# Patient Record
Sex: Female | Born: 1945 | Race: White | Hispanic: No | Marital: Married | Smoking: Former smoker
Health system: Southern US, Community
[De-identification: ages and names within clinical notes are randomized; demographics above are authoritative.]

## PROBLEM LIST (undated history)

## (undated) DIAGNOSIS — E785 Hyperlipidemia, unspecified: Secondary | ICD-10-CM

## (undated) DIAGNOSIS — F32A Depression, unspecified: Secondary | ICD-10-CM

## (undated) DIAGNOSIS — F329 Major depressive disorder, single episode, unspecified: Secondary | ICD-10-CM

## (undated) HISTORY — DX: Major depressive disorder, single episode, unspecified: F32.9

## (undated) HISTORY — DX: Hyperlipidemia, unspecified: E78.5

## (undated) HISTORY — DX: Depression, unspecified: F32.A

---

## 1988-10-04 HISTORY — PX: TOE SURGERY: SHX1073

## 1999-09-01 ENCOUNTER — Other Ambulatory Visit: Admission: RE | Admit: 1999-09-01 | Discharge: 1999-09-01 | Payer: Self-pay | Admitting: *Deleted

## 2000-08-29 ENCOUNTER — Other Ambulatory Visit: Admission: RE | Admit: 2000-08-29 | Discharge: 2000-08-29 | Payer: Self-pay | Admitting: *Deleted

## 2001-08-28 ENCOUNTER — Other Ambulatory Visit: Admission: RE | Admit: 2001-08-28 | Discharge: 2001-08-28 | Payer: Self-pay | Admitting: *Deleted

## 2002-09-10 ENCOUNTER — Other Ambulatory Visit: Admission: RE | Admit: 2002-09-10 | Discharge: 2002-09-10 | Payer: Self-pay | Admitting: *Deleted

## 2003-10-22 ENCOUNTER — Other Ambulatory Visit: Admission: RE | Admit: 2003-10-22 | Discharge: 2003-10-22 | Payer: Self-pay | Admitting: *Deleted

## 2005-04-27 ENCOUNTER — Other Ambulatory Visit: Admission: RE | Admit: 2005-04-27 | Discharge: 2005-04-27 | Payer: Self-pay | Admitting: *Deleted

## 2006-03-22 ENCOUNTER — Encounter: Admission: RE | Admit: 2006-03-22 | Discharge: 2006-03-22 | Payer: Self-pay | Admitting: Family Medicine

## 2006-03-23 ENCOUNTER — Encounter: Admission: RE | Admit: 2006-03-23 | Discharge: 2006-03-23 | Payer: Self-pay | Admitting: Family Medicine

## 2006-07-21 ENCOUNTER — Other Ambulatory Visit: Admission: RE | Admit: 2006-07-21 | Discharge: 2006-07-21 | Payer: Self-pay | Admitting: *Deleted

## 2008-07-25 ENCOUNTER — Encounter: Admission: RE | Admit: 2008-07-25 | Discharge: 2008-08-13 | Payer: Self-pay | Admitting: Family Medicine

## 2008-09-10 ENCOUNTER — Other Ambulatory Visit: Admission: RE | Admit: 2008-09-10 | Discharge: 2008-09-10 | Payer: Self-pay | Admitting: Gynecology

## 2013-09-13 ENCOUNTER — Ambulatory Visit (INDEPENDENT_AMBULATORY_CARE_PROVIDER_SITE_OTHER): Payer: Medicare Other | Admitting: Emergency Medicine

## 2013-09-13 VITALS — BP 144/80 | HR 74 | Temp 97.9°F | Resp 16 | Ht 67.0 in | Wt 187.2 lb

## 2013-09-13 DIAGNOSIS — S01312A Laceration without foreign body of left ear, initial encounter: Secondary | ICD-10-CM

## 2013-09-13 DIAGNOSIS — S01309A Unspecified open wound of unspecified ear, initial encounter: Secondary | ICD-10-CM

## 2013-09-13 NOTE — Progress Notes (Signed)
[facility] [address], [place] [address] [phone]  Date:  09/13/2013   Name:  Beth Black   DOB:  September 06, 1946   MRN:  [id number]  PCP:  No primary provider on file.    Chief Complaint: Ear Injury   History of Present Illness:  Beth Black is a 67 y.o. very pleasant female patient who presents with the following:  No recollection of injury or antecedent illness.  Noticed it was bleeding starting on arising.  Struck her head on a corner of her table.  No LOC no neuro symptoms.  Current on TT  There are no active problems to display for this patient.   History reviewed. No pertinent past medical history.  History reviewed. No pertinent past surgical history.  History  Substance Use Topics  . Smoking status: Never Smoker   . Smokeless tobacco: Not on file  . Alcohol Use: Yes    Family History  Problem Relation Age of Onset  . Hypertension Mother   . Cancer Father   . Stroke Paternal Grandmother     Allergies  Allergen Reactions  . Doxycycline Itching  . Talwin [Pentazocine] Other (See Comments)    Hallucination     Medication list has been reviewed and updated.  No current outpatient prescriptions on file prior to visit.   No current facility-administered medications on file prior to visit.    Review of Systems:  As per HPI, otherwise negative.    Physical Examination: Filed Vitals:   09/13/13 1353  BP: 144/80  Pulse: 74  Temp: 97.9 F (36.6 C)  Resp: 16   Filed Vitals:   09/13/13 1353  Height: 5\' 7"  (1.702 m)  Weight: 187 lb 3.2 oz (84.913 kg)   Body mass index is 29.31 kg/(m^2). Ideal Body Weight: Weight in (lb) to have BMI = 25: 159.3   GEN: WDWN, NAD, Non-toxic, Alert & Oriented x 3 HEENT: Atraumatic, Normocephalic.  Ears and Nose: No external deformity. EXTR: No clubbing/cyanosis/edema NEURO: Normal gait.  PSYCH: Normally interactive. Conversant. Not depressed or anxious  appearing.  Calm demeanor.  Laceration on left external ear.  Bleeding.  TM negative   Assessment and Plan: Laceration left ear ethibond Follow up as needed  Signed,  Phillips Odor, MD

## 2013-09-13 NOTE — Patient Instructions (Signed)

## 2013-09-15 ENCOUNTER — Encounter: Payer: Self-pay | Admitting: Internal Medicine

## 2013-10-19 ENCOUNTER — Encounter: Payer: Self-pay | Admitting: Internal Medicine

## 2013-12-12 ENCOUNTER — Ambulatory Visit (AMBULATORY_SURGERY_CENTER): Payer: Self-pay | Admitting: *Deleted

## 2013-12-12 VITALS — Ht 66.0 in | Wt 183.6 lb

## 2013-12-12 DIAGNOSIS — Z1211 Encounter for screening for malignant neoplasm of colon: Secondary | ICD-10-CM

## 2013-12-12 MED ORDER — MOVIPREP 100 G PO SOLR: ORAL | Status: DC

## 2013-12-12 NOTE — Progress Notes (Signed)
No allergies to eggs or soy. No problems with anesthesia.  

## 2013-12-13 ENCOUNTER — Encounter: Payer: Self-pay | Admitting: Internal Medicine

## 2013-12-20 ENCOUNTER — Encounter: Payer: Self-pay | Admitting: Internal Medicine

## 2013-12-26 ENCOUNTER — Encounter: Payer: Self-pay | Admitting: Internal Medicine

## 2013-12-26 ENCOUNTER — Ambulatory Visit (AMBULATORY_SURGERY_CENTER): Payer: Medicare Other | Admitting: Internal Medicine

## 2013-12-26 VITALS — BP 130/94 | HR 57 | Temp 97.6°F | Resp 15 | Ht 66.0 in | Wt 183.0 lb

## 2013-12-26 DIAGNOSIS — Z1211 Encounter for screening for malignant neoplasm of colon: Secondary | ICD-10-CM

## 2013-12-26 DIAGNOSIS — D126 Benign neoplasm of colon, unspecified: Secondary | ICD-10-CM

## 2013-12-26 MED ORDER — SODIUM CHLORIDE 0.9 % IV SOLN: 500.0000 mL | INTRAVENOUS | Status: DC

## 2013-12-26 NOTE — Op Note (Addendum)
[facility]  [address] [place], [id number]   COLONOSCOPY PROCEDURE REPORT  PATIENT: Beth Black, Beth Black  MR#: [id number] BIRTHDATE: [birth year] , 67  yrs. old GENDER: Female ENDOSCOPIST: Lafayette Dragon, MD REFERRED UR:[facility] Laurann Montana, M.D. PROCEDURE DATE:  12/26/2013 PROCEDURE:   Colonoscopy with snare polypectomy First Screening Colonoscopy - Avg.  risk and is 50 yrs.  old or older - No.  Prior Negative Screening - Now for repeat screening. 10 or more years since last screening  History of Adenoma - Now for follow-up colonoscopy & has been > or = to 3 yrs.  N/A  Polyps Removed Today? Yes. ASA CLASS:   Class II INDICATIONS:Average risk patient for colon cancer. MEDICATIONS: MAC sedation, administered by CRNA and propofol (Diprivan) 300mg  IV  DESCRIPTION OF PROCEDURE:   After the risks benefits and alternatives of the procedure were thoroughly explained, informed consent was obtained.  A digital rectal exam revealed no abnormalities of the rectum.   The LB [id number]  endoscope was introduced through the anus and advanced to the cecum, which was identified by both the appendix and ileocecal valve. No adverse events experienced.   The quality of the prep was good, using MoviPrep  The instrument was then slowly withdrawn as the colon was fully examined.      COLON FINDINGS: A polypoid shaped semi-pedunculated polyp measuring 12 mm in size was found in the ascending colon.  A polypectomy was performed using snare cautery.  The resection was complete and the polyp tissue was completely retrieved. there was mild diverticulosis of the sigmoid colon Retroflexed views revealed no abnormalities. The time to cecum=9 minutes 46 seconds.  Withdrawal time=8 minutes 16 seconds.  The scope was withdrawn and the procedure completed. COMPLICATIONS: There were no complications.  ENDOSCOPIC IMPRESSION: Semi-pedunculated polyp measuring 12 mm in size was found  in the ascending colon; polypectomy was performed using snare cautery mild diverticulosis of the sigmoid colon  RECOMMENDATIONS: 1.  Await pathology results 2.  hhigh fiber diet Recall colonoscopy pending path report 3.  no aspirin or NSAIDs  for 2 weeks   eSigned:  Lafayette Dragon, MD 12/26/2013 10:18 AM   cc:   PATIENT NAME:  [name], [name] MR#: [id number]

## 2013-12-26 NOTE — Progress Notes (Signed)
Called to room to assist during endoscopic procedure.  Patient ID and intended procedure confirmed with present staff. Received instructions for my participation in the procedure from the performing physician.  

## 2013-12-26 NOTE — Patient Instructions (Signed)
YOU HAD AN ENDOSCOPIC PROCEDURE TODAY AT THE [facility]: Refer to the procedure report that was given to you for any specific questions about what was found during the examination.  If the procedure report does not answer your questions, please call your gastroenterologist to clarify.  If you requested that your care partner not be given the details of your procedure findings, then the procedure report has been included in a sealed envelope for you to review at your convenience later.  YOU SHOULD EXPECT: Some feelings of bloating in the abdomen. Passage of more gas than usual.  Walking can help get rid of the air that was put into your GI tract during the procedure and reduce the bloating. If you had a lower endoscopy (such as a colonoscopy or flexible sigmoidoscopy) you may notice spotting of blood in your stool or on the toilet paper. If you underwent a bowel prep for your procedure, then you may not have a normal bowel movement for a few days.  DIET: Your first meal following the procedure should be a light meal and then it is ok to progress to your normal diet.  A half-sandwich or bowl of soup is an example of a good first meal.  Heavy or fried foods are harder to digest and may make you feel nauseous or bloated.  Likewise meals heavy in dairy and vegetables can cause extra gas to form and this can also increase the bloating.  Drink plenty of fluids but you should avoid alcoholic beverages for 24 hours.  ACTIVITY: Your care partner should take you home directly after the procedure.  You should plan to take it easy, moving slowly for the rest of the day.  You can resume normal activity the day after the procedure however you should NOT DRIVE or use heavy machinery for 24 hours (because of the sedation medicines used during the test).    SYMPTOMS TO REPORT IMMEDIATELY: A gastroenterologist can be reached at any hour.  During normal business hours, 8:30 AM to 5:00 PM Monday through Friday,  call [phone].  After hours and on weekends, please call the GI answering service at [phone] who will take a message and have the physician on call contact you.   Following lower endoscopy (colonoscopy or flexible sigmoidoscopy):  Excessive amounts of blood in the stool  Significant tenderness or worsening of abdominal pains  Swelling of the abdomen that is new, acute  Fever of 100F or higher    FOLLOW UP: If any biopsies were taken you will be contacted by phone or by letter within the next 1-3 weeks.  Call your gastroenterologist if you have not heard about the biopsies in 3 weeks.  Our staff will call the home number listed on your records the next business day following your procedure to check on you and address any questions or concerns that you may have at that time regarding the information given to you following your procedure. This is a courtesy call and so if there is no answer at the home number and we have not heard from you through the emergency physician on call, we will assume that you have returned to your regular daily activities without incident.  SIGNATURES/CONFIDENTIALITY: You and/or your care partner have signed paperwork which will be entered into your electronic medical record.  These signatures attest to the fact that that the information above on your After Visit Summary has been reviewed and is understood.  Full responsibility of the confidentiality   of this discharge information lies with you and/or your care-partner.     

## 2013-12-27 ENCOUNTER — Telehealth: Payer: Self-pay

## 2013-12-27 NOTE — Telephone Encounter (Signed)
  Follow up Call-  Call back number 12/26/2013  Post procedure Call Back phone  # [phone]  Permission to leave phone message Yes     Patient questions:  Do you have a fever, pain , or abdominal swelling? no Pain Score  0 *  Have you tolerated food without any problems? yes  Have you been able to return to your normal activities? yes  Do you have any questions about your discharge instructions: Diet   no Medications  no Follow up visit  no  Do you have questions or concerns about your Care? no  Actions: * If pain score is 4 or above: No action needed, pain <4.

## 2013-12-31 ENCOUNTER — Encounter: Payer: Self-pay | Admitting: Internal Medicine

## 2014-01-01 ENCOUNTER — Encounter: Payer: Self-pay | Admitting: *Deleted

## 2014-03-17 ENCOUNTER — Ambulatory Visit: Payer: Medicare Other | Admitting: Family Medicine

## 2014-03-17 ENCOUNTER — Ambulatory Visit (INDEPENDENT_AMBULATORY_CARE_PROVIDER_SITE_OTHER): Payer: Medicare Other

## 2014-03-17 VITALS — BP 128/82 | HR 75 | Temp 97.5°F | Resp 20 | Ht 66.0 in | Wt 185.6 lb

## 2014-03-17 DIAGNOSIS — R059 Cough, unspecified: Secondary | ICD-10-CM

## 2014-03-17 DIAGNOSIS — R0789 Other chest pain: Secondary | ICD-10-CM

## 2014-03-17 DIAGNOSIS — R05 Cough: Secondary | ICD-10-CM

## 2014-03-17 DIAGNOSIS — J22 Unspecified acute lower respiratory infection: Secondary | ICD-10-CM

## 2014-03-17 IMAGING — CR DG CHEST 2V
2 series · 2 of 2 positions shown · non-contrast
Comparison: CT [DATE]

CLINICAL DATA: Cough, chest tightness.

EXAM:
CHEST  2 VIEW

[PA]
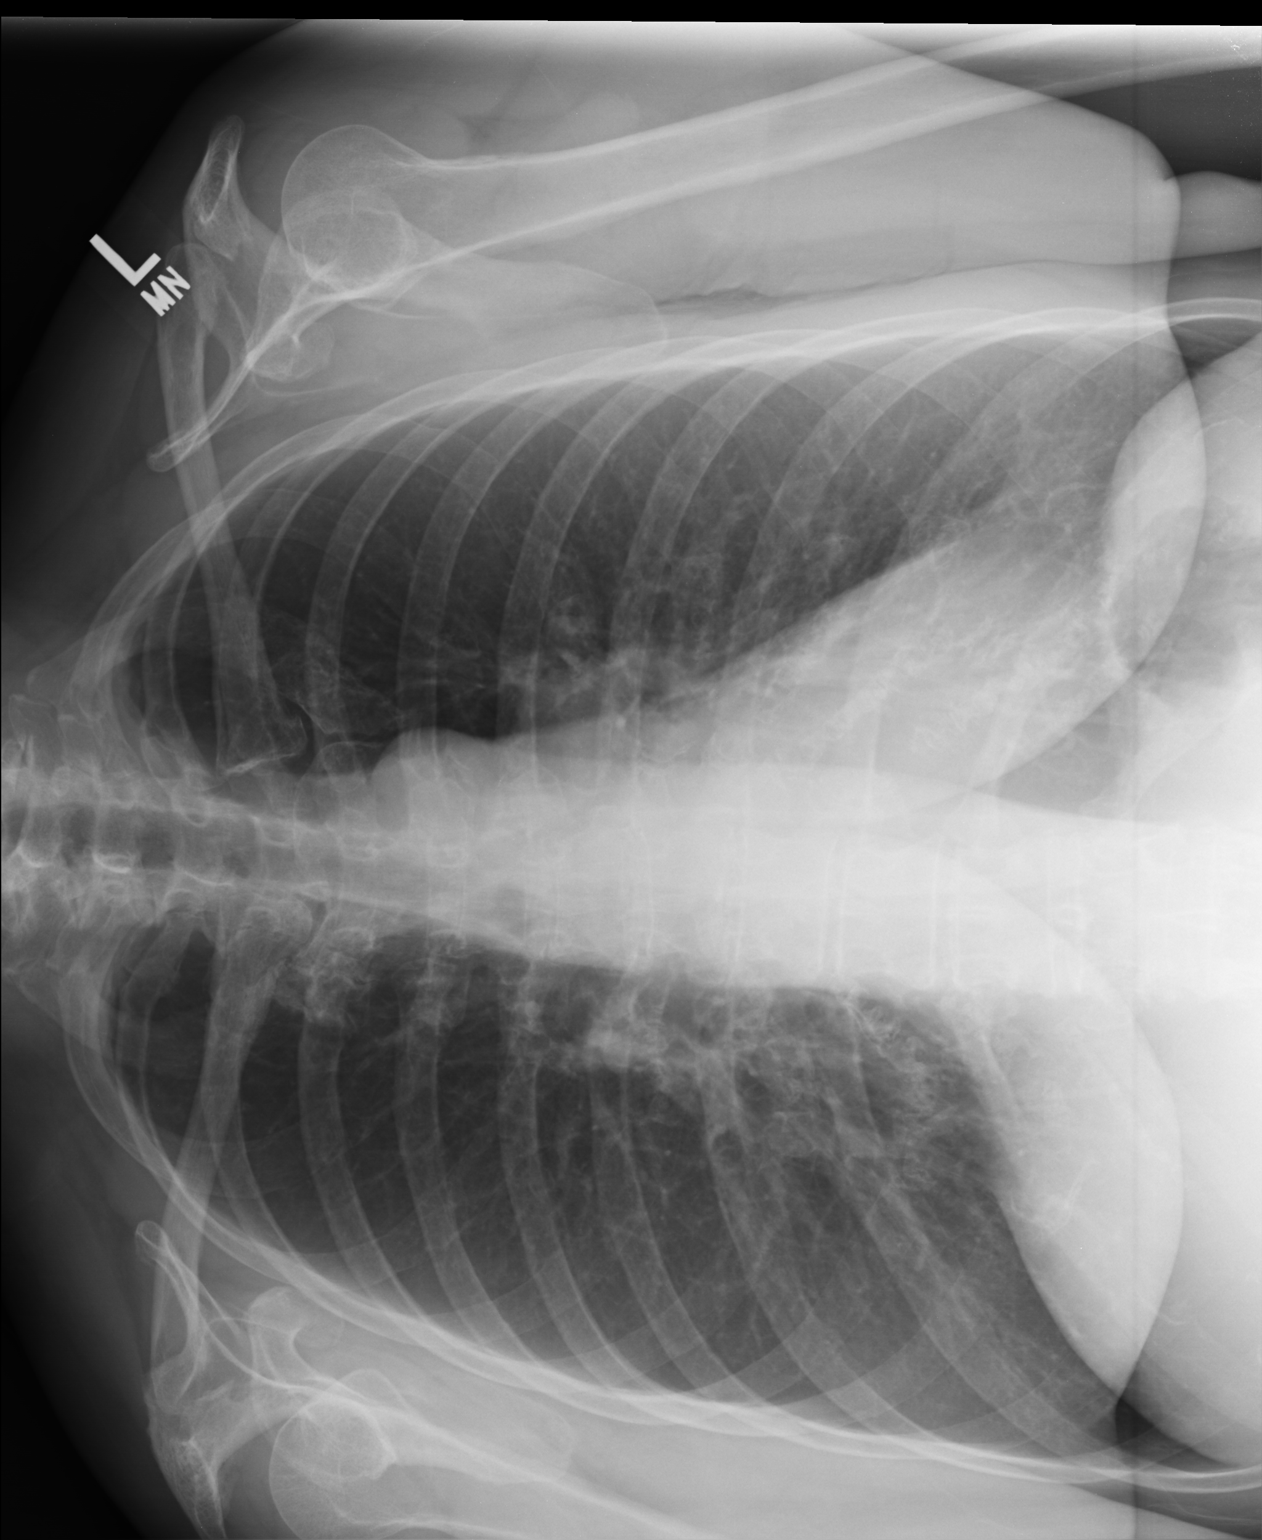

[lateral]
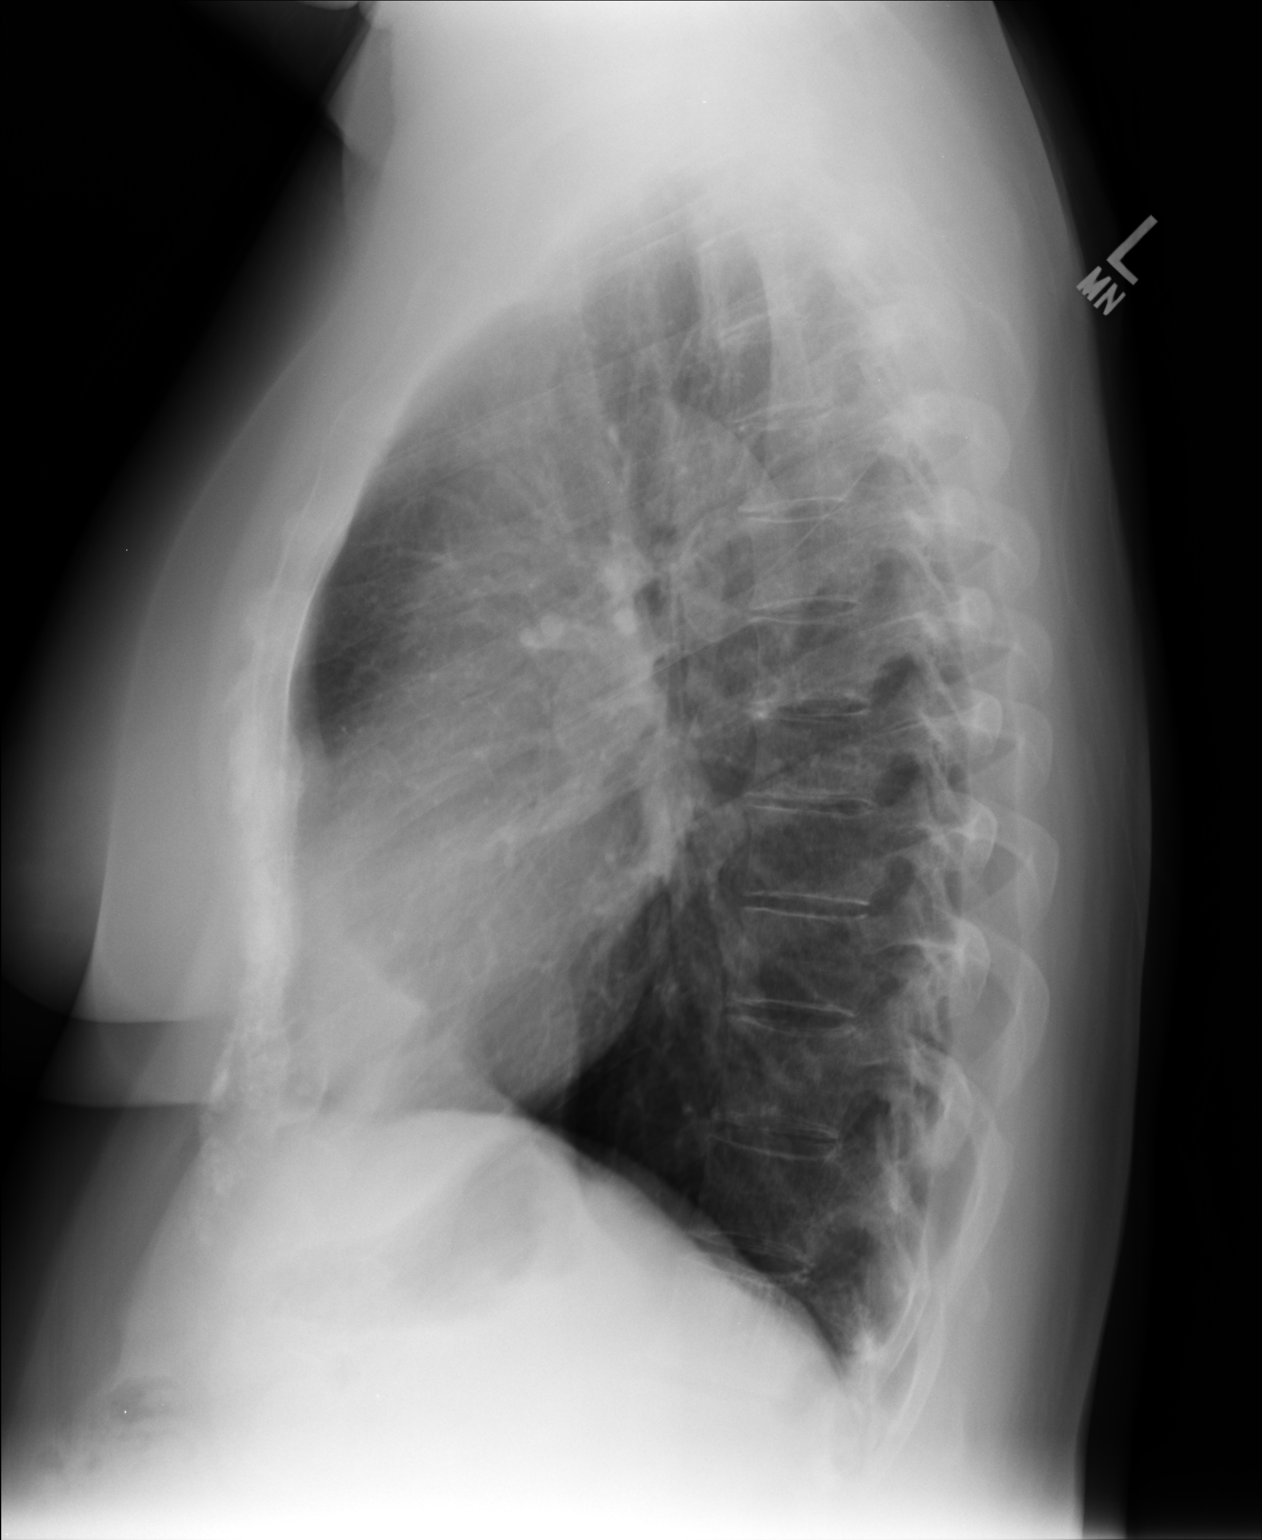

[2 of 2 positions shown; findings below may reference images not displayed]

FINDINGS: Slight hyperinflation of the lungs with increased peribronchial
thickening and bronchial markings in the lower lobes. Findings
likely suggest bronchitis. No confluent airspace opacity. No
effusion. No acute bony abnormality.
IMPRESSION: Bronchitic changes.

## 2014-03-17 MED ORDER — AMOXICILLIN-POT CLAVULANATE 875-125 MG PO TABS: 1.0000 | ORAL_TABLET | Freq: Two times a day (BID) | ORAL | Status: DC

## 2014-03-17 MED ORDER — HYDROCODONE-HOMATROPINE 5-1.5 MG/5ML PO SYRP: 5.0000 mL | ORAL_SOLUTION | Freq: Three times a day (TID) | ORAL | Status: DC | PRN

## 2014-03-17 MED ORDER — BENZONATATE 100 MG PO CAPS: 200.0000 mg | ORAL_CAPSULE | Freq: Two times a day (BID) | ORAL | Status: DC | PRN

## 2014-03-17 NOTE — Progress Notes (Signed)
 Chief Complaint:  Chief Complaint  Patient presents with  . Cough    chest congestion x 2 weeks     HPI: Beth Black is a 67 y.o. female who is here for  Cough, nonproductive otherwise clear sputum. Was visiting grandkids 2 weeks ago when this all started, thought she could just wait it out, no fevers or chills.  No allergies, she has had throat pain due to coughing. No facial pain. Has taken mucinex DM, night time liquid tabs with no relief.  History reviewed. No pertinent past medical history. Past Surgical History  Procedure Laterality Date  . Toe surgery Bilateral 1990    5th toes   History   Social History  . Marital Status: Married    Spouse Name: N/A    Number of Children: N/A  . Years of Education: N/A   Social History Main Topics  . Smoking status: Never Smoker   . Smokeless tobacco: Never Used  . Alcohol Use: 8.4 oz/week    14 Glasses of wine per week  . Drug Use: No  . Sexual Activity: None   Other Topics Concern  . None   Social History Narrative  . None   Family History  Problem Relation Age of Onset  . Hypertension Mother   . Cancer Father   . Stroke Paternal Grandmother   . Colon cancer Neg Hx    Allergies  Allergen Reactions  . Biaxin [Clarithromycin]     Feels "jittery"  . Doxycycline Itching  . Talwin [Pentazocine] Other (See Comments)    Hallucination    Prior to Admission medications   Medication Sig Start Date End Date Taking? Authorizing Provider  escitalopram (LEXAPRO) 10 MG tablet Take 10 mg by mouth daily.    Historical Provider, MD     ROS: The patient denies fevers, chills, night sweats, unintentional weight loss, chest pain, palpitations, wheezing, dyspnea on exertion, nausea, vomiting, abdominal pain, dysuria, hematuria, melena, numbness, weakness, or tingling  All other systems have been reviewed and were otherwise negative with the exception of those mentioned in the HPI and as above.    PHYSICAL  EXAM: Filed Vitals:   03/17/14 0943  BP: 128/82  Pulse: 75  Temp: 97.5 F (36.4 C)  Resp: 20   Filed Vitals:   03/17/14 0943  Height: 5\' 6"  (1.676 m)  Weight: 185 lb 9.6 oz (84.188 kg)   Body mass index is 29.97 kg/(m^2).  General: Alert, no acute distress HEENT:  Normocephalic, atraumatic, oropharynx patent. EOMI, PERRLA, TM nl, no sinus tenderness Cardiovascular:  Regular rate and rhythm, no rubs murmurs or gallops.  No Carotid bruits, radial pulse intact. No pedal edema.  Respiratory: Clear to auscultation bilaterally.  No wheezes, rales, or rhonchi.  No cyanosis, no use of accessory musculature GI: No organomegaly, abdomen is soft and non-tender, positive bowel sounds.  No masses. Skin: No rashes. Neurologic: Facial musculature symmetric. Psychiatric: Patient is appropriate throughout our interaction. Lymphatic: No cervical lymphadenopathy Musculoskeletal: Gait intact.   LABS: No results found for this or any previous visit.   EKG/XRAY:   Primary read interpreted by Dr. Marin Comment at [facility]. ? Increase vascular  Markings right lower lobe  Bronchitic changes vs infiltrate   ASSESSMENT/PLAN: Encounter Diagnoses  Name Primary?  . Cough Yes  . Chest tightness   . Lower respiratory infection    RLL PNA vs increase markings PNA, only on PA view  Will rx augmentin to treat for possible bronchitis.  RX hycodan and tessalon perles for cough  Gross sideeffects, risk and benefits, and alternatives of medications d/w patient. Patient is aware that all medications have potential sideeffects and we are unable to predict every sideeffect or drug-drug interaction that may occur.  , Spiro, DO 03/17/2014 11:21 AM    03/18/2014 LM regarding official xray results. C/w augmentin CLINICAL DATA: Cough, chest tightness.  EXAM:  CHEST 2 VIEW  COMPARISON: CT 03/23/2006  FINDINGS:  Slight hyperinflation of the lungs with increased peribronchial  thickening and bronchial markings  in the lower lobes. Findings  likely suggest bronchitis. No confluent airspace opacity. No  effusion. No acute bony abnormality.  IMPRESSION:  Bronchitic changes.  Electronically Signed  By: Rolm Baptise M.D.  On: 03/17/2014 12:05  T DO

## 2014-12-05 ENCOUNTER — Other Ambulatory Visit: Payer: Self-pay | Admitting: Dermatology

## 2015-05-21 ENCOUNTER — Other Ambulatory Visit: Payer: Self-pay | Admitting: Gynecology

## 2015-05-23 LAB — CYTOLOGY - PAP

## 2018-12-07 ENCOUNTER — Encounter: Payer: Self-pay | Admitting: Gastroenterology

## 2018-12-07 ENCOUNTER — Ambulatory Visit (INDEPENDENT_AMBULATORY_CARE_PROVIDER_SITE_OTHER): Payer: Medicare Other | Admitting: Gastroenterology

## 2018-12-07 ENCOUNTER — Encounter (INDEPENDENT_AMBULATORY_CARE_PROVIDER_SITE_OTHER): Payer: Self-pay

## 2018-12-07 VITALS — BP 140/80 | HR 71 | Ht 65.75 in | Wt 177.0 lb

## 2018-12-07 DIAGNOSIS — R1013 Epigastric pain: Secondary | ICD-10-CM

## 2018-12-07 DIAGNOSIS — Z86010 Personal history of colonic polyps: Secondary | ICD-10-CM

## 2018-12-07 MED ORDER — NA SULFATE-K SULFATE-MG SULF 17.5-3.13-1.6 GM/177ML PO SOLN: 1.0000 | ORAL | 0 refills | Status: DC

## 2018-12-07 NOTE — Progress Notes (Signed)
Referring Provider: Kelton Pillar, MD Primary Care Physician:  Kelton Pillar, MD   Reason for Consultation:  Pain in the stomach   IMPRESSION:  Intermittent epigastric pain, discrete episodes    -No alarm features History of colon polyp    -12 mm tubular adenoma in the ascending colon removed 12/26/2013 (Dr. Olevia Perches)    -Surveillance colonoscopy recommended 12/2018  Etiology of intermittent epigastric pain is unclear.  Differential is broad and includes reflux, erosive esophagitis, gastritis, peptic ulcer disease, as well as biliary colic.  The patient is particularly concerned about the possibility of gallbladder disease.  Without having daily symptoms she is uninterested in using preventive therapy such as a PPI at this time.  Given her age, I am recommending an EGD as well as an abdominal ultrasound.  She is due a surveillance colonoscopy given her history.  PLAN: EGD Colonoscopy Abdominal ultrasound HIDA if ultrasound is negative Office visit after endoscopy  I consented the patient at the bedside today discussing the risks, benefits, and alternatives to endoscopic evaluation. In particular, we discussed the risks that include, but are not limited to, reaction to medication, cardiopulmonary compromise, bleeding requiring blood transfusion, aspiration resulting in pneumonia, perforation requiring surgery, lack of diagnosis, severe illness requiring hospitalization, and even death. We reviewed the risk of missed lesion including polyps or even cancer. The patient acknowledges these risks and asks that we proceed.   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 The history is obtained through the patient and review of her electronic health record.  She has anxiety, hypercholesterolemia, and a urinary tract infection 15 years ago.  Epigastric domino pain pain. Intermittent for years. But the last two episodes have been very  severe.  The most severe episode was last week. Dr. Laurann Montana thought it was acid reflux. However, the patient finds that her reflux normally resolves with Tums. Doubling over with the pain. Associated nausea. Recent increase in eructation. Took one of her husband's Nexium and famotidine. Nothing was touching the pain. No identified triggers, although she ate two sausage biscuits the day that the pain occurred last week. No other associated symptoms. No identified exacerbating or relieving features.   Concerned about possible gallbladder disease as her husband had similar symptoms requiring a cholecystectomy.   A screening colonoscopy with Dr. Olevia Perches 12/26/2013 revealing a 12 mm semi-pedunculated tubular adenoma in the ascending colon.  She was also found to have mild sigmoid diverticulosis.  Surveillance colonoscopy recommended in 5 years.  Father with salivary gland cancer. Daughter with similar problems that turned out to be an appendix.     Past Medical History:  Diagnosis Date  . Depression   . Hyperlipidemia     Past Surgical History:  Procedure Laterality Date  . TOE SURGERY Bilateral 1990   5th toes    Current Outpatient Medications  Medication Sig Dispense Refill  . cycloSPORINE (RESTASIS) 0.05 % ophthalmic emulsion 1 drop 2 (two) times daily.    Marland Kitchen ELDERBERRY PO Take by mouth.    . escitalopram (LEXAPRO) 20 MG tablet Take 20 mg by mouth daily.    Marland Kitchen ezetimibe (ZETIA) 10 MG tablet Take 10 mg by mouth daily.    . Omega-3 Fatty Acids (FISH OIL PO) Take by mouth.    Marland Kitchen VITAMIN D, CHOLECALCIFEROL, PO Take by mouth.     No current facility-administered medications for this visit.     Allergies as of 12/07/2018 - Review Complete 12/07/2018  Allergen  Reaction Noted  . Biaxin [clarithromycin]  12/12/2013  . Doxycycline Itching 09/13/2013  . Talwin [pentazocine] Other (See Comments) 09/13/2013    Family History  Problem Relation Age of Onset  . Hypertension Mother   . Cancer  Father   . Stroke Paternal Grandmother   . Colon cancer Neg Hx     Social History   Socioeconomic History  . Marital status: Married    Spouse name: Not on file  . Number of children: Not on file  . Years of education: Not on file  . Highest education level: Not on file  Occupational History  . Not on file  Social Needs  . Financial resource strain: Not on file  . Food insecurity:    Worry: Not on file    Inability: Not on file  . Transportation needs:    Medical: Not on file    Non-medical: Not on file  Tobacco Use  . Smoking status: Never Smoker  . Smokeless tobacco: Never Used  Substance and Sexual Activity  . Alcohol use: Yes    Alcohol/week: 14.0 standard drinks    Types: 14 Glasses of wine per week  . Drug use: No  . Sexual activity: Not on file  Lifestyle  . Physical activity:    Days per week: Not on file    Minutes per session: Not on file  . Stress: Not on file  Relationships  . Social connections:    Talks on phone: Not on file    Gets together: Not on file    Attends religious service: Not on file    Active member of club or organization: Not on file    Attends meetings of clubs or organizations: Not on file    Relationship status: Not on file  . Intimate partner violence:    Fear of current or ex partner: Not on file    Emotionally abused: Not on file    Physically abused: Not on file    Forced sexual activity: Not on file  Other Topics Concern  . Not on file  Social History Narrative  . Not on file    Review of Systems: 12 system ROS is negative except as noted above with the addition of itching.  Filed Weights   12/07/18 1008  Weight: 177 lb (80.3 kg)    Physical Exam: Vital signs were reviewed. General:   Alert, well-nourished, pleasant and cooperative in NAD Head:  Normocephalic and atraumatic. Eyes:  Sclera clear, no icterus.   Conjunctiva pink. Mouth:  No deformity or lesions.   Neck:  Supple; no thyromegaly. Lungs:  Clear  throughout to auscultation.   No wheezes.  Heart:  Regular rate and rhythm; no murmurs Abdomen:  Soft, nontender, normal bowel sounds. No rebound or guarding. No hepatosplenomegaly Rectal:  Deferred  Msk:  Symmetrical without gross deformities. Extremities:  No gross deformities or edema. Neurologic:  Alert and  oriented x4;  grossly nonfocal Skin:  No rash or bruise. Psych:  Alert and cooperative. Normal mood and affect.   Damisha Wolff L. Tarri Glenn, MD, MPH [facility] 12/07/2018, 10:30 AM

## 2018-12-07 NOTE — Patient Instructions (Signed)
You have been scheduled for an abdominal ultrasound at [facility] (1st floor of hospital) on 12-15-2018 at 8:30 am. Please arrive 15 minutes prior to your appointment for registration. Make certain not to have anything to eat or drink 6 hours prior to your appointment. Should you need to reschedule your appointment, please contact radiology at [phone]. This test typically takes about 30 minutes to perform.  You have been scheduled for an endoscopy. Please follow written instructions given to you at your visit today. If you use inhalers (even only as needed), please bring them with you on the day of your procedure. Your physician has requested that you go to www.startemmi.com and enter the access code given to you at your visit today. This web site gives a general overview about your procedure. However, you should still follow specific instructions given to you by our office regarding your preparation for the procedure.  Tips for colonoscopy:  -STAY WELL HYDRATED FOR 3-4 DAYS PRIOR TO THE EXAM. This reduces nausea and dehydration.  -TO PREVENT SKIN/HEMORRHOID IRRITATION- prior to wiping, put A&Dointment or vaseline on the toilet paper. -Keep a towel or pad on the bed.  -DRINK 64oz of clear liquids in the morning of prep day (PRIOR TO STARTING THE PREP) to be sure that there is enough fluid to flush the colon and stay hydrated!!!! This is in addition to the fluids required for preparation.

## 2018-12-12 ENCOUNTER — Other Ambulatory Visit: Payer: Self-pay

## 2018-12-12 ENCOUNTER — Encounter: Payer: Self-pay | Admitting: Gastroenterology

## 2018-12-12 ENCOUNTER — Ambulatory Visit (AMBULATORY_SURGERY_CENTER): Payer: Medicare Other | Admitting: Gastroenterology

## 2018-12-12 VITALS — BP 130/67 | HR 63 | Temp 96.6°F | Resp 13 | Ht 65.0 in | Wt 177.0 lb

## 2018-12-12 DIAGNOSIS — K2980 Duodenitis without bleeding: Secondary | ICD-10-CM | POA: Diagnosis not present

## 2018-12-12 DIAGNOSIS — Z86010 Personal history of colonic polyps: Secondary | ICD-10-CM

## 2018-12-12 DIAGNOSIS — L539 Erythematous condition, unspecified: Secondary | ICD-10-CM

## 2018-12-12 DIAGNOSIS — K2970 Gastritis, unspecified, without bleeding: Secondary | ICD-10-CM | POA: Diagnosis not present

## 2018-12-12 MED ORDER — OMEPRAZOLE 40 MG PO CPDR: 40.0000 mg | DELAYED_RELEASE_CAPSULE | Freq: Every day | ORAL | 0 refills | Status: DC

## 2018-12-12 MED ORDER — SODIUM CHLORIDE 0.9 % IV SOLN: 500.0000 mL | Freq: Once | INTRAVENOUS | Status: DC

## 2018-12-12 NOTE — Progress Notes (Signed)
PT taken to PACU. Monitors in place. VSS. Report given to RN. 

## 2018-12-12 NOTE — Op Note (Addendum)
[place] Patient Name: Beth Black Procedure Date: 12/12/2018 10:52 AM MRN: [id number] Endoscopist: Thornton Park MD, MD Age: 73 Referring MD:  Date of Birth: June 04, 1946 Gender: Female Account #: [id number] Procedure:                Upper GI endoscopy Indications:              Epigastric abdominal pain Medicines:                See the Anesthesia note for documentation of the                            administered medications Procedure:                Pre-Anesthesia Assessment:                           - Prior to the procedure, a History and Physical                            was performed, and patient medications and                            allergies were reviewed. The patient's tolerance of                            previous anesthesia was also reviewed. The risks                            and benefits of the procedure and the sedation                            options and risks were discussed with the patient.                            All questions were answered, and informed consent                            was obtained. Prior Anticoagulants: The patient has                            taken no previous anticoagulant or antiplatelet                            agents. ASA Grade Assessment: II - A patient with                            mild systemic disease. After reviewing the risks                            and benefits, the patient was deemed in                            satisfactory condition to undergo the procedure.  After obtaining informed consent, the endoscope was                            passed under direct vision. Throughout the                            procedure, the patient's blood pressure, pulse, and                            oxygen saturations were monitored continuously. The                            Endoscope was introduced through the mouth, and                            advanced to the  second part of duodenum. The upper                            GI endoscopy was accomplished without difficulty.                            The patient tolerated the procedure well. Scope In: Scope Out: Findings:                 The esophagus was normal.                           Diffuse mildly erythematous mucosa without bleeding                            was found in the gastric body. Biopsies were taken                            with a cold forceps for histology.                           Diffuse mildly erythematous mucosa without active                            bleeding and with no stigmata of bleeding was found                            in the duodenal bulb. Biopsies were taken with a                            cold forceps for histology.                           The exam was otherwise without abnormality. Complications:            No immediate complications. Estimated blood loss:                            Minimal. Estimated Blood Loss:     Estimated blood loss was minimal. Impression:               -  Normal esophagus.                           - Erythematous mucosa in the gastric body. Biopsied.                           - Erythematous duodenopathy. Biopsied.                           - The examination was otherwise normal. Recommendation:           - Patient has a contact number available for                            emergencies. The signs and symptoms of potential                            delayed complications were discussed with the                            patient. Return to normal activities tomorrow.                            Written discharge instructions were provided to the                            patient.                           - Resume regular diet.                           - Continue present medications.                           - Omeprazole 40 mg daily x 8 weeks.                           - Await pathology results.                           - No  repeat upper endoscopy indicated at this time.                           - Plan outpatient abdominal ultrasound as                            previously scheduled for later this week.                           - Return to GI office in 8 weeks. Thornton Park MD, MD 12/12/2018 11:30:40 AM This report has been signed electronically.

## 2018-12-12 NOTE — Patient Instructions (Signed)
START taking omeprazole (PRILOSEC) 40 mg Once daily for 8 weeks. Medication has been sent to your preferred pharmacy.  Handouts Provided:  High Fiber Diet and Diverticulosis  YOU HAD AN ENDOSCOPIC PROCEDURE TODAY AT [facility]:   Refer to the procedure report that was given to you for any specific questions about what was found during the examination.  If the procedure report does not answer your questions, please call your gastroenterologist to clarify.  If you requested that your care partner not be given the details of your procedure findings, then the procedure report has been included in a sealed envelope for you to review at your convenience later.  YOU SHOULD EXPECT: Some feelings of bloating in the abdomen. Passage of more gas than usual.  Walking can help get rid of the air that was put into your GI tract during the procedure and reduce the bloating. If you had a lower endoscopy (such as a colonoscopy or flexible sigmoidoscopy) you may notice spotting of blood in your stool or on the toilet paper. If you underwent a bowel prep for your procedure, you may not have a normal bowel movement for a few days.  Please Note:  You might notice some irritation and congestion in your nose or some drainage.  This is from the oxygen used during your procedure.  There is no need for concern and it should clear up in a day or so.  SYMPTOMS TO REPORT IMMEDIATELY:   Following lower endoscopy (colonoscopy or flexible sigmoidoscopy):  Excessive amounts of blood in the stool  Significant tenderness or worsening of abdominal pains  Swelling of the abdomen that is new, acute  Fever of 100F or higher   Following upper endoscopy (EGD)  Vomiting of blood or coffee ground material  New chest pain or pain under the shoulder blades  Painful or persistently difficult swallowing  New shortness of breath  Fever of 100F or higher  Black, tarry-looking stools  For urgent or emergent issues, a  gastroenterologist can be reached at any hour by calling [phone].   DIET:  We do recommend a small meal at first, but then you may proceed to your regular diet.  Drink plenty of fluids but you should avoid alcoholic beverages for 24 hours.  ACTIVITY:  You should plan to take it easy for the rest of today and you should NOT DRIVE or use heavy machinery until tomorrow (because of the sedation medicines used during the test).    FOLLOW UP: Our staff will call the number listed on your records the next business day following your procedure to check on you and address any questions or concerns that you may have regarding the information given to you following your procedure. If we do not reach you, we will leave a message.  However, if you are feeling well and you are not experiencing any problems, there is no need to return our call.  We will assume that you have returned to your regular daily activities without incident.  If any biopsies were taken you will be contacted by phone or by letter within the next 1-3 weeks.  Please call us at [phone] if you have not heard about the biopsies in 3 weeks.    SIGNATURES/CONFIDENTIALITY: You and/or your care partner have signed paperwork which will be entered into your electronic medical record.  These signatures attest to the fact that that the information above on your After Visit Summary has been reviewed and is  understood.  Full responsibility of the confidentiality of this discharge information lies with you and/or your care-partner.

## 2018-12-12 NOTE — Progress Notes (Signed)
Called to room to assist during endoscopic procedure.  Patient ID and intended procedure confirmed with present staff. Received instructions for my participation in the procedure from the performing physician.  

## 2018-12-12 NOTE — Op Note (Signed)
[place] Patient Name: Beth Black Procedure Date: 12/12/2018 10:52 AM MRN: [id number] Endoscopist: Thornton Park MD, MD Age: 73 Referring MD:  Date of Birth: 1946/01/21 Gender: Female Account #: [id number] Procedure:                Colonoscopy Indications:              Surveillance: Personal history of adenomatous                            polyps on last colonoscopy 5 years ago, High risk                            colon cancer surveillance: Personal history of                            adenoma (10 mm or greater in size)Intermittent                            epigastric pain, discrete episodes                           -12 mm tubular adenoma in the ascending colon                            removed 12/26/2013 (Dr. Olevia Perches)                           -Surveillance colonoscopy recommended 12/2018 Medicines:                See the Anesthesia note for documentation of the                            administered medications Procedure:                Pre-Anesthesia Assessment:                           - Prior to the procedure, a History and Physical                            was performed, and patient medications and                            allergies were reviewed. The patient's tolerance of                            previous anesthesia was also reviewed. The risks                            and benefits of the procedure and the sedation                            options and risks were discussed with the patient.  All questions were answered, and informed consent                            was obtained. Prior Anticoagulants: The patient has                            taken no previous anticoagulant or antiplatelet                            agents. ASA Grade Assessment: II - A patient with                            mild systemic disease. After reviewing the risks                            and benefits, the patient was deemed in                           satisfactory condition to undergo the procedure.                           After obtaining informed consent, the colonoscope                            was passed under direct vision. Throughout the                            procedure, the patient's blood pressure, pulse, and                            oxygen saturations were monitored continuously. The                            Colonoscope was introduced through the anus and                            advanced to the the terminal ileum, with                            identification of the appendiceal orifice and IC                            valve. The colonoscopy was performed without                            difficulty. The patient tolerated the procedure                            well. The quality of the bowel preparation was                            good. The terminal ileum, ileocecal valve,  appendiceal orifice, and rectum were photographed. Scope In: 11:08:35 AM Scope Out: 11:23:41 AM Scope Withdrawal Time: 0 hours 12 minutes 5 seconds  Total Procedure Duration: 0 hours 15 minutes 6 seconds  Findings:                 The perianal and digital rectal examinations were                            normal.                           Multiple small and large-mouthed diverticula were                            found in the sigmoid colon, descending colon and                            ascending colon. There was no evidence of                            diverticular bleeding.                           The exam was otherwise without abnormality on                            direct and retroflexion views. Complications:            No immediate complications. Estimated Blood Loss:     Estimated blood loss: none. Impression:               - Diverticulosis in the sigmoid colon, in the                            descending colon and in the ascending colon. There                             was no evidence of diverticular bleeding.                           - The examination was otherwise normal on direct                            and retroflexion views.                           - No specimens collected. Recommendation:           - Patient has a contact number available for                            emergencies. The signs and symptoms of potential                            delayed complications were discussed with the  patient. Return to normal activities tomorrow.                            Written discharge instructions were provided to the                            patient.                           - Resume regular diet. High fiber diet recommended.                           - Continue present medications.                           - Current recommendations would be for a                            surveillance colonoscopy in 10 years. Could                            consider endoscopy if clinically appropriate at                            that time. Thornton Park MD, MD 12/12/2018 11:34:19 AM This report has been signed electronically.

## 2018-12-13 ENCOUNTER — Emergency Department (HOSPITAL_COMMUNITY): Payer: Medicare Other

## 2018-12-13 ENCOUNTER — Telehealth: Payer: Self-pay

## 2018-12-13 ENCOUNTER — Telehealth: Payer: Self-pay | Admitting: Gastroenterology

## 2018-12-13 ENCOUNTER — Emergency Department (HOSPITAL_COMMUNITY)
Admission: EM | Admit: 2018-12-13 | Discharge: 2018-12-13 | Disposition: A | Discharge status: Home / Self Care | Payer: Medicare Other | Attending: Emergency Medicine | Admitting: Emergency Medicine

## 2018-12-13 ENCOUNTER — Encounter (HOSPITAL_COMMUNITY): Payer: Self-pay

## 2018-12-13 DIAGNOSIS — Z79899 Other long term (current) drug therapy: Secondary | ICD-10-CM | POA: Insufficient documentation

## 2018-12-13 DIAGNOSIS — R1010 Upper abdominal pain, unspecified: Secondary | ICD-10-CM | POA: Diagnosis present

## 2018-12-13 DIAGNOSIS — K8020 Calculus of gallbladder without cholecystitis without obstruction: Secondary | ICD-10-CM

## 2018-12-13 DIAGNOSIS — R1013 Epigastric pain: Secondary | ICD-10-CM

## 2018-12-13 LAB — CBC WITH DIFFERENTIAL/PLATELET
Abs Immature Granulocytes: 0.03 10*3/uL (ref 0.00–0.07)
Basophils Absolute: 0 10*3/uL (ref 0.0–0.1)
Basophils Relative: 0 %
Eosinophils Absolute: 0 10*3/uL (ref 0.0–0.5)
Eosinophils Relative: 0 %
HCT: 40.6 % (ref 36.0–46.0)
Hemoglobin: 13.6 g/dL (ref 12.0–15.0)
Immature Granulocytes: 0 %
Lymphocytes Relative: 9 %
Lymphs Abs: 0.9 10*3/uL (ref 0.7–4.0)
MCH: 31.1 pg (ref 26.0–34.0)
MCHC: 33.5 g/dL (ref 30.0–36.0)
MCV: 92.7 fL (ref 80.0–100.0)
MONO ABS: 0.4 10*3/uL (ref 0.1–1.0)
Monocytes Relative: 4 %
Neutro Abs: 8.8 10*3/uL — ABNORMAL HIGH (ref 1.7–7.7)
Neutrophils Relative %: 87 %
Platelets: 197 10*3/uL (ref 150–400)
RBC: 4.38 MIL/uL (ref 3.87–5.11)
RDW: 12.3 % (ref 11.5–15.5)
WBC: 10.1 10*3/uL (ref 4.0–10.5)
nRBC: 0 % (ref 0.0–0.2)

## 2018-12-13 LAB — COMPREHENSIVE METABOLIC PANEL
ALT: 31 U/L (ref 0–44)
AST: 26 U/L (ref 15–41)
Albumin: 4.3 g/dL (ref 3.5–5.0)
Alkaline Phosphatase: 70 U/L (ref 38–126)
Anion gap: 11 (ref 5–15)
BUN: 13 mg/dL (ref 8–23)
CO2: 27 mmol/L (ref 22–32)
Calcium: 10 mg/dL (ref 8.9–10.3)
Chloride: 103 mmol/L (ref 98–111)
Creatinine, Ser: 0.79 mg/dL (ref 0.44–1.00)
GFR calc Af Amer: >60 mL/min (ref >60)
GFR calc non Af Amer: >60 mL/min (ref >60)
Glucose, Bld: 158 mg/dL — ABNORMAL HIGH (ref 70–99)
Potassium: 3.8 mmol/L (ref 3.5–5.1)
Sodium: 141 mmol/L (ref 135–145)
Total Bilirubin: 0.8 mg/dL (ref 0.3–1.2)
Total Protein: 7.2 g/dL (ref 6.5–8.1)

## 2018-12-13 LAB — LIPASE, BLOOD: Lipase: 34 U/L (ref 11–51)

## 2018-12-13 MED ORDER — OXYCODONE-ACETAMINOPHEN 5-325 MG PO TABS: 2.0000 | ORAL_TABLET | Freq: Four times a day (QID) | ORAL | 0 refills | Status: DC | PRN

## 2018-12-13 MED ORDER — DICYCLOMINE HCL 20 MG PO TABS: 20.0000 mg | ORAL_TABLET | Freq: Three times a day (TID) | ORAL | 0 refills | Status: DC | PRN

## 2018-12-13 MED ORDER — ONDANSETRON HCL 4 MG/2ML IJ SOLN
4.0000 mg | Freq: Once | INTRAMUSCULAR | Status: AC
  Administered 2018-12-13: 4 mg via INTRAVENOUS
  Filled 2018-12-13: qty 2

## 2018-12-13 MED ORDER — FENTANYL CITRATE (PF) 100 MCG/2ML IJ SOLN
50.0000 ug | Freq: Once | INTRAMUSCULAR | Status: AC
  Administered 2018-12-13: 50 ug via INTRAVENOUS
  Filled 2018-12-13: qty 2

## 2018-12-13 MED ORDER — MORPHINE SULFATE (PF) 4 MG/ML IV SOLN
4.0000 mg | Freq: Once | INTRAVENOUS | Status: AC
  Administered 2018-12-13: 4 mg via INTRAVENOUS
  Filled 2018-12-13: qty 1

## 2018-12-13 MED ORDER — ONDANSETRON 4 MG PO TBDP: 4.0000 mg | ORAL_TABLET | Freq: Four times a day (QID) | ORAL | 0 refills | Status: DC | PRN

## 2018-12-13 NOTE — ED Triage Notes (Signed)
Pt arrived via [facility] due to upper abdominal pain for 6 months. Pt had endoscopy and colonoscopy yesterday that came back unremarkable, pt has ultrasound scheduled Friday. Pt has vomited due to pain.

## 2018-12-13 NOTE — Telephone Encounter (Signed)
I am thankful that she is feeling better. I agree with your recommendations. Many thanks.

## 2018-12-13 NOTE — ED Notes (Signed)
Pt tolerating fluids with no difficulty

## 2018-12-13 NOTE — Telephone Encounter (Signed)
Spoke with patient and she is feeling better. She states she is eager to get gallbladder out. I let her know we will be sending in the referral and she should her something within the next few days. X ray has been canceled. Patient wanted to know if she should take opoid prescriptions or discontinue. I told her only if she was having severe pain otherwise she should be ok taking non NSAID pain relievers such as tylenol. Patient verbalized understanding.

## 2018-12-13 NOTE — Telephone Encounter (Signed)
12/13/2018, 3.30 AM  Patient's husband called.  She had excruciating epigastric pain associated with nausea/vomiting.  Had EGD and colonoscopy performed on 12/12/2018.  Abdominal pain was 10/10.  Advised her to go to ED via EMS.  I also called the nursing supervisor.  X-ray KUB was unremarkable Ultrasound showed cholelithiasis without any cholecystitis. Liver function tests lipase was normal  Patient responded well to fentanyl. Currently is being discharged

## 2018-12-13 NOTE — Telephone Encounter (Signed)
Pt called back and she is doing well from the procd. However she just got out the er due to having gallstones.

## 2018-12-13 NOTE — Telephone Encounter (Signed)
Merrie Roof, Thank you for your care of her early this morning.   Desiree, please call the patient this afternoon to see how she is doing. Thank you.

## 2018-12-13 NOTE — ED Provider Notes (Signed)
TIME SEEN: 5:01 AM  CHIEF COMPLAINT: Abdominal pain  HPI: Patient is a 73 year old female with history of depression and hyperlipidemia who presents to the emergency department with upper abdominal pain and vomiting.  States she has had these intermittent symptoms for 6 months.  Reports she was seen by Dr. Tarri Glenn with [facility] and had an endoscopy and colonoscopy yesterday.  States pain returned last night.  No diarrhea, bloody stools or melena.  No fever.  No chest pain.  Has tried medications for acid reflux without relief.  States that she was given a medication for acid reflux by her GI doctor which she took just prior to arrival without relief.  No previous abdominal surgery.  Has not had any imaging of her abdomen.  States she is supposed to have an ultrasound as an outpatient.  Colonoscopy yesterday revealed diverticulosis in the sigmoid colon, descending colon and ascending colon.  No diverticular bleed.  Endoscopy yesterday revealed erythematous mucosa in the gastric body and erythematous to adenopathy.  It appears she was prescribed omeprazole 40 mg to take daily for the next 8 weeks.  ROS: See HPI Constitutional: no fever  Eyes: no drainage  ENT: no runny nose   Cardiovascular:  no chest pain  Resp: no SOB  GI:  vomiting GU: no dysuria Integumentary: no rash  Allergy: no hives  Musculoskeletal: no leg swelling  Neurological: no slurred speech ROS otherwise negative  PAST MEDICAL HISTORY/PAST SURGICAL HISTORY:  Past Medical History:  Diagnosis Date  . Depression   . Hyperlipidemia     MEDICATIONS:  Prior to Admission medications   Medication Sig Start Date End Date Taking? Authorizing Provider  cycloSPORINE (RESTASIS) 0.05 % ophthalmic emulsion 1 drop 2 (two) times daily.    [provider]  ELDERBERRY PO Take by mouth.    [provider]  escitalopram (LEXAPRO) 20 MG tablet Take 20 mg by mouth daily.    [provider]   ezetimibe (ZETIA) 10 MG tablet Take 10 mg by mouth daily.    [provider]  meclizine (ANTIVERT) 25 MG tablet TAKE 1 TABLET BY MOUTH UP TO EVERY 6 HOURS AS NEEDED FOR DIZZINESS 06/19/18   [provider]  Omega-3 Fatty Acids (FISH OIL PO) Take by mouth.    [provider]  omeprazole (PRILOSEC) 40 MG capsule Take 1 capsule (40 mg total) by mouth daily. 12/12/18   Thornton Park, MD  VITAMIN D, CHOLECALCIFEROL, PO Take by mouth.    [provider]    ALLERGIES:  Allergies  Allergen Reactions  . Biaxin [Clarithromycin]     Feels "jittery"  . Doxycycline Itching  . Talwin [Pentazocine] Other (See Comments)    Hallucination     SOCIAL HISTORY:  Social History   Tobacco Use  . Smoking status: Never Smoker  . Smokeless tobacco: Never Used  Substance Use Topics  . Alcohol use: Yes    Alcohol/week: 14.0 standard drinks    Types: 14 Glasses of wine per week    FAMILY HISTORY: Family History  Problem Relation Age of Onset  . Hypertension Mother   . Cancer Father   . Stroke Paternal Grandmother   . Colon cancer Neg Hx     EXAM: BP (!) 180/91 (BP Location: Right Arm)   Pulse 65   Temp 97.6 F (36.4 C) (Oral)   Resp (!) 23   SpO2 100%  CONSTITUTIONAL: Alert and oriented and responds appropriately to questions. Well-appearing; well-nourished HEAD: Normocephalic EYES: Conjunctivae  clear, pupils appear equal, EOMI ENT: normal nose; moist mucous membranes NECK: Supple, no meningismus, no nuchal rigidity, no LAD  CARD: RRR; S1 and S2 appreciated; no murmurs, no clicks, no rubs, no gallops RESP: Normal chest excursion without splinting or tachypnea; breath sounds clear and equal bilaterally; no wheezes, no rhonchi, no rales, no hypoxia or respiratory distress, speaking full sentences ABD/GI: Normal bowel sounds; non-distended; soft, tender in the epigastric region and right upper quadrant with negative Murphy sign, no rebound, no guarding, no  peritoneal signs, no hepatosplenomegaly BACK:  The back appears normal and is non-tender to palpation, there is no CVA tenderness EXT: Normal ROM in all joints; non-tender to palpation; no edema; normal capillary refill; no cyanosis, no calf tenderness or swelling    SKIN: Normal color for age and race; warm; no rash NEURO: Moves all extremities equally PSYCH: The patient's mood and manner are appropriate. Grooming and personal hygiene are appropriate.  MEDICAL DECISION MAKING: Patient here with abdominal pain.  Has been intermittent for 6 months.  May be cholelithiasis, pancreatitis.  Less likely bowel obstruction, appendicitis.   She reports no improvement with PPI.  Will obtain labs, right upper quadrant ultrasound.  Given she did have endoscopy and colonoscopy yesterday, will obtain upright abdominal film as well.  She seems to be mostly complaining of her chronic pain.  Will give fentanyl, Zofran.  ED PROGRESS: Patient's labs reassuring.  No leukocytosis.  Normal LFTs and lipase.  Ultrasound shows gallstones without cholecystitis or choledocholithiasis.  Suspect this is the cause of her ongoing intermittent symptoms.  She reports pain improved after fentanyl but now coming back.  Will give IV morphine and p.o. challenge.  Anticipate if pain is well controlled that she will be discharged home with pain and nausea medicine and outpatient general surgery follow-up information.   7:40 AM  Pt's pain is been well controlled with IV morphine.  She feels comfortable with plan for discharge home.  Will discharge with prescriptions of Bentyl, Zofran, Percocet to take as needed and given outpatient general surgery follow-up information.  Recommended diet changes.  Discussed at length return precautions.  Patient and husband are comfortable with this plan.   At this time, I do not feel there is any life-threatening condition present. I have reviewed and discussed all results (EKG, imaging, lab, urine as  appropriate) and exam findings with patient/family. I have reviewed nursing notes and appropriate previous records.  I feel the patient is safe to be discharged home without further emergent workup and can continue workup as an outpatient as needed. Discussed usual and customary return precautions. Patient/family verbalize understanding and are comfortable with this plan.  Outpatient follow-up has been provided as needed. All questions have been answered.     Essa Malachi, Delice Bison, DO 12/13/18 [phone]

## 2018-12-13 NOTE — Telephone Encounter (Signed)
  Follow up Call-  Call back number 12/12/2018  Post procedure Call Back phone  # [phone]  Permission to leave phone message Yes  Some recent data might be hidden     Patient questions:  Do you have a fever, pain , or abdominal swelling? Yes.   Pain Score  5 *  Have you tolerated food without any problems? No.  Have you been able to return to your normal activities? No.  Do you have any questions about your discharge instructions: Diet   No. Medications  No. Follow up visit  Yes.    Do you have questions or concerns about your Care? Yes.    Actions: * If pain score is 4 or above: Physician/ provider Notified : Joelene Millin L. Beavers, MD.  Patient in pain but has pain medicine available, just doesn't want to take at the moment, doesn't like narcotics according to caregiver.  Patient seen in the ED last night for pain, nausea, and throwing up.  She consulted the after hours number for [facility], and the person she spoke to told her to go to the ED.  Patient's results were gallstones according to caregiver.  Questioning next step, hoping someone to get her into surgery for her gallbladder.

## 2018-12-13 NOTE — Telephone Encounter (Signed)
No answer, left message to call back later today, B.Jeris Easterly RN. 

## 2018-12-13 NOTE — Discharge Instructions (Addendum)

## 2018-12-13 NOTE — ED Notes (Signed)
Bed: WA17 Expected date:  Expected time:  Means of arrival:  Comments: EMS Dr. Gupta-endoscopy/colonoscopy yesterday-now severe abdominal pain

## 2018-12-14 ENCOUNTER — Telehealth: Payer: Self-pay | Admitting: Gastroenterology

## 2018-12-14 NOTE — Telephone Encounter (Signed)
Pt's husband Fritz Pickerel would like to speak with you regarding pt appt with surgeon to remove her gallbladder. He stated that her wife is in pain and nobody has called them yet to schedule an appt with the surgeon.

## 2018-12-14 NOTE — Telephone Encounter (Signed)
Patient was in the ED last night for abdominal pain.  Referral was faxed to [facility] first thing this am.  She reports terrible pain on her RUQ if she is not absolutely still.  She is advised that if her pain is not well controlled at home she should return to the ED.  Patient has tried dicyclomine and percocet with very little relief.

## 2018-12-15 ENCOUNTER — Ambulatory Visit (HOSPITAL_COMMUNITY): Payer: Medicare Other

## 2018-12-18 ENCOUNTER — Ambulatory Visit: Payer: Self-pay | Admitting: Surgery

## 2018-12-18 NOTE — H&P (Signed)
Beth Black [facility], [place] Documented: 12/18/2018 3:06 PM Location: [facility] Patient #: [id number] DOB: August 11, 1946 Married / Language: English / Race: White Female  History of Present Illness [name] A. [name] MD; 12/18/2018 3:33 PM) Patient words: Patient sent at the request of Dr. Leonides Schanz for epigastric bowel pain for the last 6 months. Over the last 4 weeks, the pain is becoming more intense. It is episodic. Location is epigastric and right upper quadrant with associated nausea and vomiting. She was seen last week in the emergency room where an ultrasound showed gallstones. She is worked up with upper and lower endoscopy and treated for reflux which have not helped.           ADDENDUM REPORT: 12/13/2018 06:10 ADDENDUM: Please excuse dictation error: There are multiple small layering gallbladder calculi with no gallbladder wall thickening or focal tenderness. No renal calculi were visualized. Electronically Signed By: Monte Fantasia M.D. On: 12/13/2018 06:10 Addended by Gilford Silvius, MD on 12/13/2018 6:13 AM  Study Result CLINICAL DATA: Epigastric pain EXAM: ULTRASOUND ABDOMEN LIMITED RIGHT UPPER QUADRANT COMPARISON: None. FINDINGS: Gallbladder: Multiple small layering wall thickening calculi or focal tenderness. No gallbladder Common bile duct: Limited visualization. Diameter: 3 mm. Where visualized, no filling defect. Liver: No focal lesion identified. Within normal limits in parenchymal echogenicity. Portal vein is patent on color Doppler imaging with normal direction of blood flow towards the liver. IMPRESSION: Multiple small renal calculi. No evidence of acute cholecystitis. Electronically Signed: By: Monte Fantasia M.D. On: 12/13/2018 06:01.  The patient is a 73 year old female.   Past Surgical History (Beth Black, [place]; 12/18/2018 3:06 PM) Breast Biopsy Left. Colon Polyp Removal - Colonoscopy Foot Surgery  Bilateral.  Diagnostic Studies History (Beth Black, [place]; 12/18/2018 3:06 PM) Colonoscopy within last year Mammogram within last year Pap Smear 1-5 years ago  Allergies (Beth Black, [place]; 12/18/2018 3:08 PM) Biaxin *MACROLIDES* Toluene *CHEMICALS* Doxycycline *DERMATOLOGICALS* Allergies Reconciled  Medication History (Beth Black, RMA; 12/18/2018 3:09 PM) Zetia (10MG  Tablet, Oral) Active. Restasis (0.05% Emulsion, Ophthalmic) Active. Escitalopram Oxalate (20MG  Tablet, Oral) Active. Medications Reconciled  Social History (Beth Black, [place]; 12/18/2018 3:06 PM) Alcohol use Moderate alcohol use. Caffeine use Coffee. No drug use Tobacco use Former smoker.  Family History (Beth Black, RMA; 12/18/2018 3:06 PM) Cancer Father. Cerebrovascular Accident Mother. Hypertension Mother.  Pregnancy / Birth History (Beth Black, [place]; 12/18/2018 3:06 PM) Age at menarche 72 years. Age of menopause 51-55 Contraceptive History Oral contraceptives. Gravida 1 Irregular periods Length (months) of breastfeeding 7-12 Maternal age 88-30 Para 1  Other Problems (Beth Black, [place]; 12/18/2018 3:06 PM) Anxiety Disorder Depression Hypercholesterolemia     Review of Systems (Beth A. Brown RMA; 12/18/2018 3:06 PM) General Present- Fatigue. Not Present- Appetite Loss, Chills, Fever, Night Sweats, Weight Gain and Weight Loss. Skin Present- Dryness. Not Present- Change in Wart/Mole, Hives, Jaundice, New Lesions, Non-Healing Wounds, Rash and Ulcer. HEENT Present- Wears glasses/contact lenses. Not Present- Earache, Hearing Loss, Hoarseness, Nose Bleed, Oral Ulcers, Ringing in the Ears, Seasonal Allergies, Sinus Pain, Sore Throat, Visual Disturbances and Yellow Eyes. Breast Not Present- Breast Mass, Breast Pain, Nipple Discharge and Skin Changes. Cardiovascular Not Present- Chest Pain, Difficulty Breathing Lying Down, Leg Cramps, Palpitations,  Rapid Heart Rate, Shortness of Breath and Swelling of Extremities. Gastrointestinal Present- Excessive gas. Not Present- Abdominal Pain, Bloating, Bloody Stool, Change in Bowel Habits, Chronic diarrhea, Constipation, Difficulty Swallowing, Gets full quickly at meals, Hemorrhoids, Indigestion, Nausea, Rectal Pain and Vomiting.  Female Genitourinary Not Present- Frequency, Nocturia, Painful Urination, Pelvic Pain and Urgency. Musculoskeletal Not Present- Back Pain, Joint Pain, Joint Stiffness, Muscle Pain, Muscle Weakness and Swelling of Extremities. Neurological Not Present- Decreased Memory, Fainting, Headaches, Numbness, Seizures, Tingling, Tremor, Trouble walking and Weakness. Psychiatric Present- Anxiety. Not Present- Bipolar, Change in Sleep Pattern, Depression, Fearful and Frequent crying. Endocrine Not Present- Cold Intolerance, Excessive Hunger, Hair Changes, Heat Intolerance, Hot flashes and New Diabetes. Hematology Not Present- Blood Thinners, Easy Bruising, Excessive bleeding, Gland problems, HIV and Persistent Infections.  Vitals (Beth A. Brown RMA; 12/18/2018 3:07 PM) 12/18/2018 3:06 PM Weight: 175 lb Height: 65in Body Surface Area: 1.87 m Body Mass Index: 29.12 kg/m  Temp.: 97.69F  Pulse: 79 (Regular)  BP: 128/84 (Sitting, Left Arm, Standard)      Physical Exam (Beth Black A. Naif Alabi MD; 12/18/2018 3:33 PM)  General Mental Status-Alert. General Appearance-Consistent with stated age. Hydration-Well hydrated. Voice-Normal.  Head and Neck Head-normocephalic, atraumatic with no lesions or palpable masses.  Eye Eyeball - Bilateral-Extraocular movements intact. Sclera/Conjunctiva - Bilateral-No scleral icterus.  Chest and Lung Exam Chest and lung exam reveals -quiet, even and easy respiratory effort with no use of accessory muscles and on auscultation, normal breath sounds, no adventitious sounds and normal vocal resonance. Inspection Chest Wall -  Normal. Back - normal.  Cardiovascular Cardiovascular examination reveals -on palpation PMI is normal in location and amplitude, no palpable S3 or S4. Normal cardiac borders., normal heart sounds, regular rate and rhythm with no murmurs, carotid auscultation reveals no bruits and normal pedal pulses bilaterally.  Abdomen Inspection Inspection of the abdomen reveals - No Hernias. Skin - Scar - no surgical scars. Palpation/Percussion Palpation and Percussion of the abdomen reveal - Soft, Non Tender, No Rebound tenderness, No Rigidity (guarding) and No hepatosplenomegaly. Auscultation Auscultation of the abdomen reveals - Bowel sounds normal.  Neurologic Neurologic evaluation reveals -alert and oriented x 3 with no impairment of recent or remote memory. Mental Status-Normal.  Musculoskeletal Normal Exam - Left-Upper Extremity Strength Normal and Lower Extremity Strength Normal. Normal Exam - Right-Upper Extremity Strength Normal, Lower Extremity Weakness.    Assessment & Plan (Joshua Soulier A. Jordanny Waddington MD; 12/18/2018 3:31 PM)  SYMPTOMATIC CHOLELITHIASIS (K80.20) Impression: Recommend laparoscopic cholecystectomy with possible cholangiogram. Discussed the pathophysiology of gallstone disease in her progression of symptoms which would be best treated with cholecystectomy. She is in agreement. The procedure has been discussed with the patient. Risks of laparoscopic cholecystectomy include bleeding, infection, bile duct injury, leak, death, open surgery, diarrhea, other surgery, organ injury, blood vessel injury, DVT, and additional care.  Current Plans You are being scheduled for surgery- Our schedulers will call you.  You should hear from our office's scheduling department within 5 working days about the location, date, and time of surgery. We try to make accommodations for patient's preferences in scheduling surgery, but sometimes the OR schedule or the surgeon's schedule prevents [place]  from making those accommodations.  If you have not heard from our office [phone]) in 5 working days, call the office and ask for your surgeon's nurse.  If you have other questions about your diagnosis, plan, or surgery, call the office and ask for your surgeon's nurse.  Pt Education - Pamphlet Given - Laparoscopic Gallbladder Surgery: discussed with patient and provided information. The anatomy & physiology of hepatobiliary & pancreatic function was discussed. The pathophysiology of gallbladder dysfunction was discussed. Natural history risks without surgery was discussed. I feel the risks of no intervention will lead to serious problems that  outweigh the operative risks; therefore, I recommended cholecystectomy to remove the pathology. I explained laparoscopic techniques with possible need for an open approach. Probable cholangiogram to evaluate the bilary tract was explained as well.  Risks such as bleeding, infection, abscess, leak, injury to other organs, need for further treatment, heart attack, death, and other risks were discussed. I noted a good likelihood this will help address the problem. Possibility that this will not correct all abdominal symptoms was explained. Goals of post-operative recovery were discussed as well. We will work to minimize complications. An educational handout further explaining the pathology and treatment options was given as well. Questions were answered. The patient expresses understanding & wishes to proceed with surgery.  Pt Education - CCS Laparosopic Post Op HCI ([place])

## 2018-12-19 ENCOUNTER — Telehealth: Payer: Self-pay | Admitting: Gastroenterology

## 2018-12-19 NOTE — Telephone Encounter (Signed)
See results of bx results for additional details

## 2018-12-19 NOTE — Telephone Encounter (Signed)
Hi Sheri, pt called stating that she received a call from the office yesterday after 5:00pm. She stated that person who called left a message for pt to call back. I was not able to find any notes, did you by any chance call her?

## 2018-12-21 ENCOUNTER — Encounter (HOSPITAL_COMMUNITY): Payer: Self-pay

## 2018-12-21 ENCOUNTER — Inpatient Hospital Stay (HOSPITAL_COMMUNITY)
Admission: EM | Admit: 2018-12-21 | Discharge: 2018-12-25 | DRG: 419 | Disposition: A | Discharge status: Home / Self Care | Payer: Medicare Other | Attending: General Surgery | Admitting: General Surgery

## 2018-12-21 ENCOUNTER — Other Ambulatory Visit: Payer: Self-pay

## 2018-12-21 DIAGNOSIS — Z888 Allergy status to other drugs, medicaments and biological substances status: Secondary | ICD-10-CM

## 2018-12-21 DIAGNOSIS — K8065 Calculus of gallbladder and bile duct with chronic cholecystitis with obstruction: Secondary | ICD-10-CM | POA: Diagnosis not present

## 2018-12-21 DIAGNOSIS — Z823 Family history of stroke: Secondary | ICD-10-CM

## 2018-12-21 DIAGNOSIS — Z8249 Family history of ischemic heart disease and other diseases of the circulatory system: Secondary | ICD-10-CM

## 2018-12-21 DIAGNOSIS — Z419 Encounter for procedure for purposes other than remedying health state, unspecified: Secondary | ICD-10-CM

## 2018-12-21 DIAGNOSIS — Z79899 Other long term (current) drug therapy: Secondary | ICD-10-CM

## 2018-12-21 DIAGNOSIS — K8010 Calculus of gallbladder with chronic cholecystitis without obstruction: Secondary | ICD-10-CM | POA: Diagnosis present

## 2018-12-21 DIAGNOSIS — E785 Hyperlipidemia, unspecified: Secondary | ICD-10-CM | POA: Diagnosis present

## 2018-12-21 DIAGNOSIS — R932 Abnormal findings on diagnostic imaging of liver and biliary tract: Secondary | ICD-10-CM

## 2018-12-21 DIAGNOSIS — F329 Major depressive disorder, single episode, unspecified: Secondary | ICD-10-CM | POA: Diagnosis present

## 2018-12-21 DIAGNOSIS — K8050 Calculus of bile duct without cholangitis or cholecystitis without obstruction: Secondary | ICD-10-CM | POA: Diagnosis present

## 2018-12-21 DIAGNOSIS — K8080 Other cholelithiasis without obstruction: Secondary | ICD-10-CM

## 2018-12-21 DIAGNOSIS — Z881 Allergy status to other antibiotic agents status: Secondary | ICD-10-CM

## 2018-12-21 LAB — CBC
HCT: 40.2 % (ref 36.0–46.0)
Hemoglobin: 13.3 g/dL (ref 12.0–15.0)
MCH: 30.9 pg (ref 26.0–34.0)
MCHC: 33.1 g/dL (ref 30.0–36.0)
MCV: 93.3 fL (ref 80.0–100.0)
Platelets: 288 10*3/uL (ref 150–400)
RBC: 4.31 MIL/uL (ref 3.87–5.11)
RDW: 11.9 % (ref 11.5–15.5)
WBC: 8.3 10*3/uL (ref 4.0–10.5)
nRBC: 0 % (ref 0.0–0.2)

## 2018-12-21 LAB — URINALYSIS, ROUTINE W REFLEX MICROSCOPIC
Bilirubin Urine: NEGATIVE
GLUCOSE, UA: NEGATIVE mg/dL
Hgb urine dipstick: NEGATIVE
Ketones, ur: NEGATIVE mg/dL
Leukocytes,Ua: NEGATIVE
Nitrite: NEGATIVE
PH: 6 (ref 5.0–8.0)
Protein, ur: NEGATIVE mg/dL
Specific Gravity, Urine: 1.019 (ref 1.005–1.030)

## 2018-12-21 LAB — COMPREHENSIVE METABOLIC PANEL
ALT: 57 U/L — AB (ref 0–44)
AST: 29 U/L (ref 15–41)
Albumin: 4.1 g/dL (ref 3.5–5.0)
Alkaline Phosphatase: 108 U/L (ref 38–126)
Anion gap: 9 (ref 5–15)
BUN: 13 mg/dL (ref 8–23)
CHLORIDE: 101 mmol/L (ref 98–111)
CO2: 27 mmol/L (ref 22–32)
CREATININE: 0.8 mg/dL (ref 0.44–1.00)
Calcium: 8.7 mg/dL — ABNORMAL LOW (ref 8.9–10.3)
GFR calc Af Amer: >60 mL/min (ref >60)
GFR calc non Af Amer: >60 mL/min (ref >60)
Glucose, Bld: 98 mg/dL (ref 70–99)
Potassium: 4.7 mmol/L (ref 3.5–5.1)
Sodium: 137 mmol/L (ref 135–145)
Total Bilirubin: 1.4 mg/dL — ABNORMAL HIGH (ref 0.3–1.2)
Total Protein: 7.1 g/dL (ref 6.5–8.1)

## 2018-12-21 LAB — LIPASE, BLOOD: LIPASE: 48 U/L (ref 11–51)

## 2018-12-21 MED ORDER — OXYCODONE HCL 5 MG PO TABS: 5.0000 mg | ORAL_TABLET | ORAL | Status: DC | PRN

## 2018-12-21 MED ORDER — SODIUM CHLORIDE 0.9 % IV SOLN
2.0000 g | INTRAVENOUS | Status: DC
  Administered 2018-12-22 – 2018-12-24 (×3): 2 g via INTRAVENOUS
  Filled 2018-12-21: qty 20
  Filled 2018-12-21 (×3): qty 2

## 2018-12-21 MED ORDER — SODIUM CHLORIDE 0.9 % IV SOLN
2.0000 g | Freq: Once | INTRAVENOUS | Status: AC
  Administered 2018-12-22: 2 g via INTRAVENOUS
  Filled 2018-12-21: qty 2

## 2018-12-21 MED ORDER — MORPHINE SULFATE (PF) 4 MG/ML IV SOLN
4.0000 mg | Freq: Once | INTRAVENOUS | Status: AC
  Administered 2018-12-21: 4 mg via INTRAVENOUS
  Filled 2018-12-21: qty 1

## 2018-12-21 MED ORDER — KCL IN DEXTROSE-NACL 20-5-0.45 MEQ/L-%-% IV SOLN
INTRAVENOUS | Status: DC
  Administered 2018-12-21 – 2018-12-23 (×4): via INTRAVENOUS
  Filled 2018-12-21 (×8): qty 1000

## 2018-12-21 MED ORDER — ONDANSETRON HCL 4 MG/2ML IJ SOLN: 4.0000 mg | Freq: Four times a day (QID) | INTRAMUSCULAR | Status: DC | PRN

## 2018-12-21 MED ORDER — FENTANYL CITRATE (PF) 100 MCG/2ML IJ SOLN
50.0000 ug | Freq: Once | INTRAMUSCULAR | Status: AC
  Administered 2018-12-21: 50 ug via INTRAVENOUS
  Filled 2018-12-21: qty 2

## 2018-12-21 MED ORDER — PANTOPRAZOLE SODIUM 40 MG IV SOLR
40.0000 mg | Freq: Every day | INTRAVENOUS | Status: DC
  Administered 2018-12-21 – 2018-12-24 (×4): 40 mg via INTRAVENOUS
  Filled 2018-12-21 (×4): qty 40

## 2018-12-21 MED ORDER — SODIUM CHLORIDE 0.9% FLUSH
3.0000 mL | Freq: Once | INTRAVENOUS | Status: AC
  Administered 2018-12-21: 3 mL via INTRAVENOUS

## 2018-12-21 MED ORDER — ONDANSETRON 4 MG PO TBDP: 4.0000 mg | ORAL_TABLET | Freq: Four times a day (QID) | ORAL | Status: DC | PRN

## 2018-12-21 MED ORDER — ACETAMINOPHEN 325 MG PO TABS: 650.0000 mg | ORAL_TABLET | Freq: Four times a day (QID) | ORAL | Status: DC | PRN

## 2018-12-21 MED ORDER — ONDANSETRON HCL 4 MG/2ML IJ SOLN
4.0000 mg | Freq: Once | INTRAMUSCULAR | Status: AC
  Administered 2018-12-21: 4 mg via INTRAVENOUS
  Filled 2018-12-21: qty 2

## 2018-12-21 MED ORDER — SODIUM CHLORIDE 0.9 % IV BOLUS
500.0000 mL | Freq: Once | INTRAVENOUS | Status: AC
  Administered 2018-12-21: 500 mL via INTRAVENOUS

## 2018-12-21 MED ORDER — HYDROMORPHONE HCL 1 MG/ML IJ SOLN
1.0000 mg | INTRAMUSCULAR | Status: DC | PRN
  Administered 2018-12-21 (×2): 1 mg via INTRAVENOUS
  Filled 2018-12-21 (×3): qty 1

## 2018-12-21 MED ORDER — ACETAMINOPHEN 650 MG RE SUPP: 650.0000 mg | Freq: Four times a day (QID) | RECTAL | Status: DC | PRN

## 2018-12-21 NOTE — H&P (Signed)
Beth Black is an 73 y.o. female.    General Surgery [facility]  Chief Complaint: abdominal pain, gallstones  HPI: Patient is a 73 year old female known to my surgical practice having seen my partner, Dr. Christie Beckers, for symptomatic cholelithiasis.  Patient had been scheduled for cholecystectomy in April.  Today she developed episodes of biliary colic which have been unrelenting since 6 AM this morning.  She denies nausea or vomiting.  She has had chills.  Patient presents to the emergency department.  Despite administration of intravenous narcotics, the patient continues to be symptomatic.  General surgery is called for evaluation.  Prior abdominal ultrasound demonstrates multiple gallstones.  Laboratory studies today show minimal change in liver function test.  Patient denies any prior abdominal surgery.  She is accompanied today by her husband.  Past Medical History:  Diagnosis Date  . Depression   . Hyperlipidemia     Past Surgical History:  Procedure Laterality Date  . TOE SURGERY Bilateral 1990   5th toes    Family History  Problem Relation Age of Onset  . Hypertension Mother   . Cancer Father   . Stroke Paternal Grandmother   . Colon cancer Neg Hx    Social History:  reports that she has never smoked. She has never used smokeless tobacco. She reports current alcohol use of about 14.0 standard drinks of alcohol per week. She reports that she does not use drugs.  Allergies:  Allergies  Allergen Reactions  . Biaxin [Clarithromycin]     Feels "jittery"  . Doxycycline Itching  . Talwin [Pentazocine] Other (See Comments)    Hallucination     (Not in a hospital admission)   Results for orders placed or performed during the hospital encounter of 12/21/18 (from the past 48 hour(s))  Lipase, blood     Status: None   Collection Time: 12/21/18  2:46 PM  Result Value Ref Range   Lipase 48 11 - 51 U/L    Comment: Performed at [facility], [place] [address] [place], [place] [address]  Comprehensive metabolic panel     Status: Abnormal   Collection Time: 12/21/18  2:46 PM  Result Value Ref Range   Sodium 137 135 - 145 mmol/L   Potassium 4.7 3.5 - 5.1 mmol/L   Chloride 101 98 - 111 mmol/L   CO2 27 22 - 32 mmol/L   Glucose, Bld 98 70 - 99 mg/dL   BUN 13 8 - 23 mg/dL   Creatinine, Ser 0.80 0.44 - 1.00 mg/dL   Calcium 8.7 (L) 8.9 - 10.3 mg/dL   Total Protein 7.1 6.5 - 8.1 g/dL   Albumin 4.1 3.5 - 5.0 g/dL   AST 29 15 - 41 U/L   ALT 57 (H) 0 - 44 U/L   Alkaline Phosphatase 108 38 - 126 U/L   Total Bilirubin 1.4 (H) 0.3 - 1.2 mg/dL   GFR calc non Af Amer >60 >60 mL/min   GFR calc Af Amer >60 >60 mL/min   Anion gap 9 5 - 15    Comment: Performed at [facility], [place] [address], [place], [place] [address]  CBC     Status: None   Collection Time: 12/21/18  2:46 PM  Result Value Ref Range   WBC 8.3 4.0 - 10.5 K/uL   RBC 4.31 3.87 - 5.11 MIL/uL   Hemoglobin 13.3 12.0 - 15.0 g/dL   HCT 40.2 36.0 - 46.0 %  MCV 93.3 80.0 - 100.0 fL   MCH 30.9 26.0 - 34.0 pg   MCHC 33.1 30.0 - 36.0 g/dL   RDW 11.9 11.5 - 15.5 %   Platelets 288 150 - 400 K/uL   nRBC 0.0 0.0 - 0.2 %    Comment: Performed at [facility], [place] [address], [place], [place] [address]   No results found.  Review of Systems  Constitutional: Positive for chills. Negative for diaphoresis and fever.  HENT: Negative.   Eyes: Negative.   Respiratory: Negative.   Cardiovascular: Negative.   Gastrointestinal: Positive for abdominal pain. Negative for nausea and vomiting.  Genitourinary: Negative.   Musculoskeletal: Negative.   Skin: Negative.   Neurological: Negative.   Endo/Heme/Allergies: Negative.   Psychiatric/Behavioral: Positive for depression.  All other systems reviewed and are negative.   Blood pressure (!) 164/90, pulse 80, temperature 98.1 F (36.7 C), temperature source Oral, resp. rate 16,  height 5\' 5"  (1.651 m), weight 80.2 kg, SpO2 98 %. Physical Exam  Constitutional: She is oriented to person, place, and time. She appears well-developed and well-nourished. She appears distressed.  HENT:  Head: Normocephalic and atraumatic.  Mouth/Throat: No oropharyngeal exudate.  Eyes: Pupils are equal, round, and reactive to light. Conjunctivae are normal. No scleral icterus.  Neck: Normal range of motion. Neck supple. No tracheal deviation present. No thyromegaly present.  Cardiovascular: Normal rate, regular rhythm and normal heart sounds.  No murmur heard. Respiratory: Effort normal and breath sounds normal.  GI: Soft. Bowel sounds are normal. She exhibits no distension and no mass. There is abdominal tenderness (mild RUQ). There is no rebound and no guarding.  Musculoskeletal: Normal range of motion.        General: No deformity or edema.  Neurological: She is alert and oriented to person, place, and time.  Skin: Skin is warm and dry. She is not diaphoretic.  Psychiatric: She has a normal mood and affect. Her behavior is normal.     Assessment/Plan Unrelenting biliary colic, chronic cholecystitis, cholelithiasis  Patient will be admitted from the emergency department to the general surgery service.  We will treat her pain and nausea as well as administer a proton pump inhibitor.  Patient would like to proceed with cholecystectomy during this admission.  Provided that the operating room schedule will allow for an additional case tomorrow or over the weekend, we will plan to proceed with cholecystectomy.  The patient and her husband are in agreement with this plan.  The risks and benefits of the procedure have been discussed at length with the patient.  The patient understands the proposed procedure, potential alternative treatments, and the course of recovery to be expected.  All of the patient's questions have been answered at this time.  The patient wishes to proceed with  surgery.  Armandina Gemma, [facility] Office: [phone]    Armandina Gemma, MD 12/21/2018, 4:34 PM

## 2018-12-21 NOTE — ED Provider Notes (Signed)
  Face-to-face evaluation   History: She presents for evaluation of worsening right upper quadrant leaving abdominal pain with diarrhea.  Recently diagnosed with cholelithiasis, used from 12/13/2018 referred to general surgery.  Patient states that she was doing pretty well yesterday, and did eat some fish that had been fried.  She denies fever, cough or chest pain.  Physical exam: Alert, calm, cooperative.  Heart regular rate and rhythm.  Lungs clear anteriorly.  Abdomen soft with mild right upper quadrant tenderness.  Medical screening examination/treatment/procedure(s) were conducted as a shared visit with non-physician practitioner(s) and myself.  I personally evaluated the patient during the encounter     Daleen Bo, MD 12/23/18 1041

## 2018-12-21 NOTE — ED Triage Notes (Signed)
Patient c/o RUQ pain and diarrhea since 0600 today. patient states she is scheduled for a cholecystectomy 01/29/19

## 2018-12-21 NOTE — ED Notes (Signed)
Transport called to take pt to floor.  

## 2018-12-21 NOTE — ED Provider Notes (Signed)
[facility] DEPT Provider Note   CSN: [id number] Arrival date & time: 12/21/18  1357    History   Chief Complaint Chief Complaint  Patient presents with   Abdominal Pain   Diarrhea    HPI Beth Black is a 73 y.o. female with past medical history of depression, hyperlipidemia, who presents today for evaluation of right upper quadrant abdominal pain.  She was seen for similar on 12/13/2018.  She is scheduled for cholecystectomy on 4/22 with Dr. Brantley Stage.  She has known gallstones.  She reports that at 6 AM her pain woke her up from her sleep.  She reports nausea without vomiting.  She has had 3 loose bowel movements today.  She reports that her pain is a 9-10 out of 10 and feels like it radiates through to her back.  She denies any chest pain or shortness of breath.  No recent sick contacts.  She states that she attempted to call the surgeon twice today and they told her if her pain was this bad she needed to come to the emergency room.       HPI  Past Medical History:  Diagnosis Date   Depression    Hyperlipidemia     Patient Active Problem List   Diagnosis Date Noted   Biliary colic 40/98/1191   Cholelithiasis with chronic cholecystitis 12/21/2018    Past Surgical History:  Procedure Laterality Date   TOE SURGERY Bilateral 1990   5th toes     OB History   No obstetric history on file.      Home Medications    Prior to Admission medications   Medication Sig Start Date End Date Taking? Authorizing Provider  acetaminophen (TYLENOL) 500 MG tablet Take 500-1,000 mg by mouth every 6 (six) hours as needed for moderate pain.   Yes [provider]  cycloSPORINE (RESTASIS) 0.05 % ophthalmic emulsion Place 1 drop into both eyes 2 (two) times daily.    Yes [provider]  dicyclomine (BENTYL) 20 MG tablet Take 1 tablet (20 mg total) by mouth every 8 (eight) hours as needed for spasms (Abdominal cramping).  12/13/18  Yes Ward, Kristen N, DO  ELDERBERRY PO Take 2 tablets by mouth daily.   Yes [provider]  escitalopram (LEXAPRO) 20 MG tablet Take 20 mg by mouth daily.   Yes [provider]  ezetimibe (ZETIA) 10 MG tablet Take 10 mg by mouth daily.   Yes [provider]  Fish Oil-Vitamin D 1000-1000 MG-UNIT CAPS Take 3 capsules by mouth daily.   Yes [provider]  omeprazole (PRILOSEC) 40 MG capsule Take 1 capsule (40 mg total) by mouth daily. 12/12/18  Yes Thornton Park, MD  ondansetron (ZOFRAN ODT) 4 MG disintegrating tablet Take 1 tablet (4 mg total) by mouth every 6 (six) hours as needed for nausea or vomiting. 12/13/18   Ward, Delice Bison, DO  oxyCODONE-acetaminophen (PERCOCET/ROXICET) 5-325 MG tablet Take 2 tablets by mouth every 6 (six) hours as needed. 12/13/18   Ward, Delice Bison, DO    Family History Family History  Problem Relation Age of Onset   Hypertension Mother    Cancer Father    Stroke Paternal Grandmother    Colon cancer Neg Hx     Social History Social History   Tobacco Use   Smoking status: Never Smoker   Smokeless tobacco: Never Used  Substance Use Topics   Alcohol use: Yes    Alcohol/week: 14.0 standard drinks  Types: 14 Glasses of wine per week   Drug use: No     Allergies   Biaxin [clarithromycin]; Doxycycline; and Talwin [pentazocine]   Review of Systems Review of Systems  Constitutional: Negative for chills and fever.  Respiratory: Negative for cough and shortness of breath.   Cardiovascular: Negative for chest pain.  Gastrointestinal: Positive for abdominal pain, diarrhea and nausea. Negative for blood in stool and vomiting.  Musculoskeletal: Positive for back pain.  Neurological: Negative for dizziness and weakness.  All other systems reviewed and are negative.    Physical Exam Updated Vital Signs BP 126/72    Pulse 72    Temp 98.1 F (36.7 C) (Oral)    Resp 20    Ht 5\' 5"  (1.651 m)    Wt 80.2  kg    SpO2 98%    BMI 29.42 kg/m   Physical Exam Vitals signs and nursing note reviewed.  Constitutional:      Appearance: She is well-developed.     Comments: Appears uncomfortable.   HENT:     Head: Normocephalic and atraumatic.     Mouth/Throat:     Mouth: Mucous membranes are moist.  Eyes:     Conjunctiva/sclera: Conjunctivae normal.  Neck:     Musculoskeletal: Neck supple.  Cardiovascular:     Rate and Rhythm: Normal rate and regular rhythm.     Heart sounds: Normal heart sounds. No murmur.  Pulmonary:     Effort: Pulmonary effort is normal. No respiratory distress.     Breath sounds: Normal breath sounds.  Abdominal:     General: Bowel sounds are normal.     Palpations: Abdomen is soft.     Tenderness: There is abdominal tenderness (Worse in RUQ) in the right upper quadrant and epigastric area.     Hernia: No hernia is present.  Skin:    General: Skin is warm and dry.  Neurological:     Mental Status: She is alert.      ED Treatments / Results  Labs (all labs ordered are listed, but only abnormal results are displayed) Labs Reviewed  COMPREHENSIVE METABOLIC PANEL - Abnormal; Notable for the following components:      Result Value   Calcium 8.7 (*)    ALT 57 (*)    Total Bilirubin 1.4 (*)    All other components within normal limits  LIPASE, BLOOD  CBC  URINALYSIS, ROUTINE W REFLEX MICROSCOPIC    EKG None  Radiology No results found.  Procedures Procedures (including critical care time)  Medications Ordered in ED Medications  sodium chloride flush (NS) 0.9 % injection 3 mL (3 mLs Intravenous Given 12/21/18 1445)  fentaNYL (SUBLIMAZE) injection 50 mcg (50 mcg Intravenous Given 12/21/18 1440)  ondansetron (ZOFRAN) injection 4 mg (4 mg Intravenous Given 12/21/18 1439)  morphine 4 MG/ML injection 4 mg (4 mg Intravenous Given by Other 12/21/18 1545)  sodium chloride 0.9 % bolus 500 mL (0 mLs Intravenous Stopped 12/21/18 1645)     Initial Impression /  Assessment and Plan / ED Course  I have reviewed the triage vital signs and the nursing notes.  Pertinent labs & imaging results that were available during my care of the patient were reviewed by me and considered in my medical decision making (see chart for details).  Clinical Course as of Dec 21 1654  Thu Dec 21, 2018  1430 Informed patient RN that paid meds are ordered as patient is in significant pain.    [EH]  1612  Spoke with surgery who will come see patient.    [EH]    Clinical Course User Index [EH] Lorin Glass, PA-C       Patient presents today for evaluation of right upper quadrant abdominal pain and diarrhea.  She has known cholelithiasis and is scheduled for outpatient cholecystectomy next month.  On my exam she appeared uncomfortable with right upper quadrant abdominal pain.  Her pain was initially treated with fentanyl, and Zofran.  This controlled her pain briefly however once it wore off her pain fully returned and she was given morphine. Labs were obtained and reviewed CBC and lipase are both unremarkable.  CMP shows slight elevation in total bilirubin to 1.4, ALT is very slightly elevated at 57, however she is without other electrolyte or hematologic derangements.  She is hemodynamically stable here.  I spoke with on-call surgeon, Dr. Harlow Asa after labs returned, for additional guidance.  Surgeon on call agreed to come see patient.  Patient will be admitted by surgery for pain control and cholecystectomy.  This patient was seen as a shared visit with Dr. Eulis Foster. Patient was hemodynamically stable while in the emergency room.   Final Clinical Impressions(s) / ED Diagnoses   Final diagnoses:  Biliary calculus of other site without obstruction    ED Discharge Orders    None       Lorin Glass, Hershal Coria 12/21/18 1700    Daleen Bo, MD 12/23/18 1040

## 2018-12-22 ENCOUNTER — Encounter (HOSPITAL_COMMUNITY): Payer: Self-pay | Admitting: Emergency Medicine

## 2018-12-22 ENCOUNTER — Encounter (HOSPITAL_COMMUNITY): Admission: EM | Disposition: A | Discharge status: Home / Self Care | Payer: Self-pay | Source: Home / Self Care

## 2018-12-22 ENCOUNTER — Observation Stay (HOSPITAL_COMMUNITY): Payer: Medicare Other

## 2018-12-22 ENCOUNTER — Observation Stay (HOSPITAL_COMMUNITY): Payer: Medicare Other | Admitting: Certified Registered"

## 2018-12-22 DIAGNOSIS — K8064 Calculus of gallbladder and bile duct with chronic cholecystitis without obstruction: Secondary | ICD-10-CM | POA: Diagnosis not present

## 2018-12-22 DIAGNOSIS — R932 Abnormal findings on diagnostic imaging of liver and biliary tract: Secondary | ICD-10-CM | POA: Diagnosis not present

## 2018-12-22 HISTORY — PX: CHOLECYSTECTOMY: SHX55

## 2018-12-22 LAB — SURGICAL PCR SCREEN
MRSA, PCR: NEGATIVE
Staphylococcus aureus: NEGATIVE

## 2018-12-22 SURGERY — LAPAROSCOPIC CHOLECYSTECTOMY WITH INTRAOPERATIVE CHOLANGIOGRAM
Anesthesia: General | Site: Abdomen

## 2018-12-22 MED ORDER — CEFAZOLIN SODIUM-DEXTROSE 2-4 GM/100ML-% IV SOLN
2.0000 g | INTRAVENOUS | Status: DC
  Filled 2018-12-22: qty 100

## 2018-12-22 MED ORDER — ESCITALOPRAM OXALATE 20 MG PO TABS
20.0000 mg | ORAL_TABLET | Freq: Every day | ORAL | Status: DC
  Administered 2018-12-22 – 2018-12-24 (×3): 20 mg via ORAL
  Filled 2018-12-22 (×3): qty 1

## 2018-12-22 MED ORDER — CEFAZOLIN SODIUM-DEXTROSE 2-4 GM/100ML-% IV SOLN
INTRAVENOUS | Status: AC
  Filled 2018-12-22: qty 100

## 2018-12-22 MED ORDER — GABAPENTIN 300 MG PO CAPS
300.0000 mg | ORAL_CAPSULE | ORAL | Status: AC
  Administered 2018-12-22: 300 mg via ORAL
  Filled 2018-12-22: qty 1

## 2018-12-22 MED ORDER — FENTANYL CITRATE (PF) 100 MCG/2ML IJ SOLN
INTRAMUSCULAR | Status: AC
  Filled 2018-12-22: qty 2

## 2018-12-22 MED ORDER — EPHEDRINE SULFATE-NACL 50-0.9 MG/10ML-% IV SOSY
PREFILLED_SYRINGE | INTRAVENOUS | Status: DC | PRN
  Administered 2018-12-22: 10 mg via INTRAVENOUS
  Administered 2018-12-22 (×2): 5 mg via INTRAVENOUS

## 2018-12-22 MED ORDER — SUGAMMADEX SODIUM 200 MG/2ML IV SOLN
INTRAVENOUS | Status: AC
  Filled 2018-12-22: qty 2

## 2018-12-22 MED ORDER — PROPOFOL 10 MG/ML IV BOLUS
INTRAVENOUS | Status: AC
  Filled 2018-12-22: qty 40

## 2018-12-22 MED ORDER — IOPAMIDOL (ISOVUE-300) INJECTION 61%
INTRAVENOUS | Status: DC | PRN
  Administered 2018-12-22: 10 mL

## 2018-12-22 MED ORDER — DEXAMETHASONE SODIUM PHOSPHATE 10 MG/ML IJ SOLN
INTRAMUSCULAR | Status: AC
  Filled 2018-12-22: qty 1

## 2018-12-22 MED ORDER — BUPIVACAINE-EPINEPHRINE (PF) 0.25% -1:200000 IJ SOLN
INTRAMUSCULAR | Status: AC
  Filled 2018-12-22: qty 30

## 2018-12-22 MED ORDER — ONDANSETRON HCL 4 MG/2ML IJ SOLN
INTRAMUSCULAR | Status: AC
  Filled 2018-12-22: qty 2

## 2018-12-22 MED ORDER — DEXAMETHASONE SODIUM PHOSPHATE 10 MG/ML IJ SOLN
INTRAMUSCULAR | Status: DC | PRN
  Administered 2018-12-22: 8 mg via INTRAVENOUS

## 2018-12-22 MED ORDER — PROPOFOL 10 MG/ML IV BOLUS
INTRAVENOUS | Status: DC | PRN
  Administered 2018-12-22: 150 mg via INTRAVENOUS

## 2018-12-22 MED ORDER — CHLORHEXIDINE GLUCONATE CLOTH 2 % EX PADS: 6.0000 | MEDICATED_PAD | Freq: Once | CUTANEOUS | Status: DC

## 2018-12-22 MED ORDER — FENTANYL CITRATE (PF) 100 MCG/2ML IJ SOLN
25.0000 ug | INTRAMUSCULAR | Status: DC | PRN
  Administered 2018-12-22 (×3): 50 ug via INTRAVENOUS

## 2018-12-22 MED ORDER — BUPIVACAINE-EPINEPHRINE 0.5% -1:200000 IJ SOLN
INTRAMUSCULAR | Status: DC | PRN
  Administered 2018-12-22: 20 mL

## 2018-12-22 MED ORDER — FENTANYL CITRATE (PF) 250 MCG/5ML IJ SOLN
INTRAMUSCULAR | Status: DC | PRN
  Administered 2018-12-22 (×2): 50 ug via INTRAVENOUS
  Administered 2018-12-22: 100 ug via INTRAVENOUS

## 2018-12-22 MED ORDER — LACTATED RINGERS IV SOLN
INTRAVENOUS | Status: DC
  Administered 2018-12-22: 10:00:00 via INTRAVENOUS

## 2018-12-22 MED ORDER — SUGAMMADEX SODIUM 200 MG/2ML IV SOLN
INTRAVENOUS | Status: DC | PRN
  Administered 2018-12-22: 200 mg via INTRAVENOUS

## 2018-12-22 MED ORDER — CHLORHEXIDINE GLUCONATE CLOTH 2 % EX PADS
6.0000 | MEDICATED_PAD | Freq: Once | CUTANEOUS | Status: AC
  Administered 2018-12-22: 6 via TOPICAL

## 2018-12-22 MED ORDER — ROCURONIUM BROMIDE 10 MG/ML (PF) SYRINGE
PREFILLED_SYRINGE | INTRAVENOUS | Status: DC | PRN
  Administered 2018-12-22: 30 mg via INTRAVENOUS
  Administered 2018-12-22 (×3): 10 mg via INTRAVENOUS

## 2018-12-22 MED ORDER — ACETAMINOPHEN 500 MG PO TABS
1000.0000 mg | ORAL_TABLET | Freq: Three times a day (TID) | ORAL | Status: DC
  Administered 2018-12-22 – 2018-12-24 (×6): 1000 mg via ORAL
  Filled 2018-12-22 (×7): qty 2

## 2018-12-22 MED ORDER — GADOBUTROL 1 MMOL/ML IV SOLN
8.0000 mL | Freq: Once | INTRAVENOUS | Status: AC | PRN
  Administered 2018-12-22: 8 mL via INTRAVENOUS

## 2018-12-22 MED ORDER — IOPAMIDOL (ISOVUE-300) INJECTION 61%
INTRAVENOUS | Status: AC
  Filled 2018-12-22: qty 50

## 2018-12-22 MED ORDER — ACETAMINOPHEN 500 MG PO TABS
1000.0000 mg | ORAL_TABLET | ORAL | Status: AC
  Administered 2018-12-22: 1000 mg via ORAL
  Filled 2018-12-22: qty 2

## 2018-12-22 MED ORDER — LIDOCAINE 2% (20 MG/ML) 5 ML SYRINGE
INTRAMUSCULAR | Status: DC | PRN
  Administered 2018-12-22: 60 mg via INTRAVENOUS

## 2018-12-22 MED ORDER — OXYCODONE HCL 5 MG PO TABS: 5.0000 mg | ORAL_TABLET | Freq: Once | ORAL | Status: DC | PRN

## 2018-12-22 MED ORDER — SODIUM CHLORIDE (PF) 0.9 % IJ SOLN
INTRAMUSCULAR | Status: DC | PRN
  Administered 2018-12-22: 10 mL

## 2018-12-22 MED ORDER — ROCURONIUM BROMIDE 10 MG/ML (PF) SYRINGE
PREFILLED_SYRINGE | INTRAVENOUS | Status: AC
  Filled 2018-12-22: qty 10

## 2018-12-22 MED ORDER — LIDOCAINE 2% (20 MG/ML) 5 ML SYRINGE
INTRAMUSCULAR | Status: AC
  Filled 2018-12-22: qty 5

## 2018-12-22 MED ORDER — 0.9 % SODIUM CHLORIDE (POUR BTL) OPTIME
TOPICAL | Status: DC | PRN
  Administered 2018-12-22: 1000 mL

## 2018-12-22 MED ORDER — ONDANSETRON HCL 4 MG/2ML IJ SOLN
INTRAMUSCULAR | Status: DC | PRN
  Administered 2018-12-22: 4 mg via INTRAVENOUS

## 2018-12-22 MED ORDER — SUCCINYLCHOLINE CHLORIDE 200 MG/10ML IV SOSY
PREFILLED_SYRINGE | INTRAVENOUS | Status: DC | PRN
  Administered 2018-12-22: 120 mg via INTRAVENOUS

## 2018-12-22 MED ORDER — CYCLOSPORINE 0.05 % OP EMUL
1.0000 [drp] | Freq: Two times a day (BID) | OPHTHALMIC | Status: DC
  Administered 2018-12-22 – 2018-12-24 (×5): 1 [drp] via OPHTHALMIC
  Filled 2018-12-22 (×6): qty 30

## 2018-12-22 MED ORDER — LACTATED RINGERS IR SOLN
Status: DC | PRN
  Administered 2018-12-22: 1000 mL

## 2018-12-22 MED ORDER — OXYCODONE HCL 5 MG/5ML PO SOLN: 5.0000 mg | Freq: Once | ORAL | Status: DC | PRN

## 2018-12-22 MED ORDER — ONDANSETRON HCL 4 MG/2ML IJ SOLN: 4.0000 mg | Freq: Once | INTRAMUSCULAR | Status: DC | PRN

## 2018-12-22 SURGICAL SUPPLY — 39 items
APPLIER CLIP ROT 10 11.4 M/L (STAPLE) ×3
CABLE HIGH FREQUENCY MONO STRZ (ELECTRODE) ×3 IMPLANT
CHLORAPREP W/TINT 26ML (MISCELLANEOUS) ×6 IMPLANT
CLIP APPLIE ROT 10 11.4 M/L (STAPLE) ×1 IMPLANT
CLOSURE WOUND 1/2 X4 (GAUZE/BANDAGES/DRESSINGS) ×1
COVER MAYO STAND STRL (DRAPES) ×3 IMPLANT
COVER SURGICAL LIGHT HANDLE (MISCELLANEOUS) ×3 IMPLANT
COVER WAND RF STERILE (DRAPES) IMPLANT
DECANTER SPIKE VIAL GLASS SM (MISCELLANEOUS) ×3 IMPLANT
DERMABOND ADVANCED (GAUZE/BANDAGES/DRESSINGS) ×2
DERMABOND ADVANCED .7 DNX12 (GAUZE/BANDAGES/DRESSINGS) ×1 IMPLANT
DRAIN CHANNEL 19F RND (DRAIN) ×3 IMPLANT
DRAPE C-ARM 42X120 X-RAY (DRAPES) ×3 IMPLANT
ELECT REM PT RETURN 15FT ADLT (MISCELLANEOUS) ×3 IMPLANT
EVACUATOR SILICONE 100CC (DRAIN) ×3 IMPLANT
GAUZE SPONGE 2X2 8PLY STRL LF (GAUZE/BANDAGES/DRESSINGS) ×1 IMPLANT
GLOVE SURG ORTHO 8.0 STRL STRW (GLOVE) ×3 IMPLANT
GOWN STRL REUS W/TWL XL LVL3 (GOWN DISPOSABLE) ×6 IMPLANT
HEMOSTAT SURGICEL 4X8 (HEMOSTASIS) IMPLANT
KIT BASIN OR (CUSTOM PROCEDURE TRAY) ×3 IMPLANT
KIT TURNOVER KIT A (KITS) IMPLANT
POUCH SPECIMEN RETRIEVAL 10MM (ENDOMECHANICALS) ×3 IMPLANT
SCISSORS LAP 5X35 DISP (ENDOMECHANICALS) ×3 IMPLANT
SET CHOLANGIOGRAPH MIX (MISCELLANEOUS) ×3 IMPLANT
SET IRRIG TUBING LAPAROSCOPIC (IRRIGATION / IRRIGATOR) ×3 IMPLANT
SET TUBE SMOKE EVAC HIGH FLOW (TUBING) IMPLANT
SLEEVE XCEL OPT CAN 5 100 (ENDOMECHANICALS) ×3 IMPLANT
SPONGE GAUZE 2X2 STER 10/PKG (GAUZE/BANDAGES/DRESSINGS) ×2
STRIP CLOSURE SKIN 1/2X4 (GAUZE/BANDAGES/DRESSINGS) ×2 IMPLANT
SUT ETHILON 2 0 PS N (SUTURE) ×3 IMPLANT
SUT MNCRL AB 4-0 PS2 18 (SUTURE) ×6 IMPLANT
SWAB COLLECTION DEVICE MRSA (MISCELLANEOUS) ×3 IMPLANT
TOWEL OR 17X26 10 PK STRL BLUE (TOWEL DISPOSABLE) ×3 IMPLANT
TOWEL OR NON WOVEN STRL DISP B (DISPOSABLE) ×3 IMPLANT
TRAY LAPAROSCOPIC (CUSTOM PROCEDURE TRAY) ×3 IMPLANT
TROCAR BLADELESS OPT 5 100 (ENDOMECHANICALS) ×3 IMPLANT
TROCAR XCEL BLUNT TIP 100MML (ENDOMECHANICALS) ×3 IMPLANT
TROCAR XCEL NON-BLD 11X100MML (ENDOMECHANICALS) ×3 IMPLANT
TUBE ANAEROBIC SPECIMEN COL (MISCELLANEOUS) ×3 IMPLANT

## 2018-12-22 NOTE — Consult Note (Signed)
Referring Provider: Dr. Armandina Gemma  Primary Care Physician:  Kelton Pillar, MD Primary Gastroenterologist:  Dr. Thornton Park   Reason for Consultation:  Acute cholecystitis, cholelithiasis, choledocholithiasis.  S/P Laparoscopic cholecystectomy with intra-operative cholangiography  HPI: Beth Black is a 73 y.o. female with a history of depression, hyperlipidemia, symptomatic cholelithiasis epigastric pain for the past 6 months. She was seen in the ED for similar epigastric pain on  12/13/2018. She ah EGD and colonoscopy with Dr. Tarri Glenn on 12/12/2018. EGD showed duodenitis, stomach biopsies were normal. She was prescribed Omeprazole 46m QD. Colonoscopy showed diverticulosis. Abdominal sonogram in the ED showed multiple small layering wall thickening calculi to the gallbladder. No evidence of acute cholecystitis.  Her LFTs were normal. Her abdominal pain was well controlled after receiving Morphine IV. She was discharged home. She was scheduled for an elective cholecystectomy  01/24/2019 with  Dr. CBrantley Stage She developed severe epigastric pain this morning which awakened her from sleep with some nausea, no vomiting. She had 3 loose bowel movements this morning, no blood. She presented to the ED. Se underwent a laparoscopic cholecystectomy today by Dr. GHarlow Asa An intraoperative cholangiogram was + for a single filling defect to the CBD, stone can not be excluded.  Plan for possible  ERCP tomorrow.   Labs 12/21/2018: Sodium 137.  Potassium 4.7.  Glucose 98.  BUN 13.  Creatinine 0.80.  Calcium 8.7.  Alk phos 108.  Albumin 4.1.  Lipase 48.  AST 29.  ALT 57.  Total bili 1.4. WBC 8.3.  Hemoglobin 13.3.  Hematocrit 40.2.  MCV 93.3.  Platelet 288.  GI history: EGD 12/12/2018:Normal esophagus. Erythematous mucosa in the gastric body, normal bx. Erythematous duodenopathy bx showed duodenitis. Colonoscopy 12/12/2018: Multiple small and large mouth diverticula in the sigmoid colon, descending  colon and ascending colon.  Surveillance colonoscopy in 10 years.   Past Medical History:  Diagnosis Date  . Depression   . Hyperlipidemia     Past Surgical History:  Procedure Laterality Date  . TOE SURGERY Bilateral 1990   5th toes    Prior to Admission medications   Medication Sig Start Date End Date Taking? Authorizing Provider  acetaminophen (TYLENOL) 500 MG tablet Take 500-1,000 mg by mouth every 6 (six) hours as needed for moderate pain.   Yes [provider]  cycloSPORINE (RESTASIS) 0.05 % ophthalmic emulsion Place 1 drop into both eyes 2 (two) times daily.    Yes [provider]  dicyclomine (BENTYL) 20 MG tablet Take 1 tablet (20 mg total) by mouth every 8 (eight) hours as needed for spasms (Abdominal cramping). 12/13/18  Yes Ward, Kristen N, DO  ELDERBERRY PO Take 2 tablets by mouth daily.   Yes [provider]  escitalopram (LEXAPRO) 20 MG tablet Take 20 mg by mouth daily.   Yes [provider]  ezetimibe (ZETIA) 10 MG tablet Take 10 mg by mouth daily.   Yes [provider]  Fish Oil-Vitamin D 1000-1000 MG-UNIT CAPS Take 3 capsules by mouth daily.   Yes [provider]  omeprazole (PRILOSEC) 40 MG capsule Take 1 capsule (40 mg total) by mouth daily. 12/12/18  Yes BThornton Park MD  ondansetron (ZOFRAN ODT) 4 MG disintegrating tablet Take 1 tablet (4 mg total) by mouth every 6 (six) hours as needed for nausea or vomiting. 12/13/18   Ward, KDelice Bison DO  oxyCODONE-acetaminophen (PERCOCET/ROXICET) 5-325 MG tablet Take 2 tablets by mouth every 6 (six) hours as needed. 12/13/18   Ward, KDelice Bison DO  Current Facility-Administered Medications  Medication Dose Route Frequency Provider Last Rate Last Dose  . [MAR Hold] acetaminophen (TYLENOL) tablet 650 mg  650 mg Oral Q6H PRN Armandina Gemma, MD       Or  . Doug Sou Hold] acetaminophen (TYLENOL) suppository 650 mg  650 mg Rectal Q6H PRN Armandina Gemma, MD      . ceFAZolin (ANCEF)  2-4 GM/100ML-% IVPB           . [MAR Hold] cefTRIAXone (ROCEPHIN) 2 g in sodium chloride 0.9 % 100 mL IVPB  2 g Intravenous Q24H Armandina Gemma, MD      . Chlorhexidine Gluconate Cloth 2 % PADS 6 each  6 each Topical Once Cornett, Thomas, MD      . dextrose 5 % and 0.45 % NaCl with KCl 20 mEq/L infusion   Intravenous Continuous Armandina Gemma, MD 100 mL/hr at 12/21/18 2029    . fentaNYL (SUBLIMAZE) 100 MCG/2ML injection           . fentaNYL (SUBLIMAZE) injection 25-50 mcg  25-50 mcg Intravenous Q5 min PRN Lidia Collum, MD   50 mcg at 12/22/18 1404  . [MAR Hold] HYDROmorphone (DILAUDID) injection 1 mg  1 mg Intravenous Q2H PRN Armandina Gemma, MD   1 mg at 12/21/18 2338  . lactated ringers infusion   Intravenous Continuous Brennan Bailey, MD 50 mL/hr at 12/22/18 1028    . [MAR Hold] ondansetron (ZOFRAN-ODT) disintegrating tablet 4 mg  4 mg Oral Q6H PRN Armandina Gemma, MD       Or  . Doug Sou Hold] ondansetron (ZOFRAN) injection 4 mg  4 mg Intravenous Q6H PRN Armandina Gemma, MD      . ondansetron (ZOFRAN) injection 4 mg  4 mg Intravenous Once PRN Lidia Collum, MD      . oxyCODONE (Oxy IR/ROXICODONE) immediate release tablet 5 mg  5 mg Oral Once PRN Lidia Collum, MD       Or  . oxyCODONE (ROXICODONE) 5 MG/5ML solution 5 mg  5 mg Oral Once PRN Lidia Collum, MD      . Doug Sou Hold] oxyCODONE (Oxy IR/ROXICODONE) immediate release tablet 5-10 mg  5-10 mg Oral Q4H PRN Armandina Gemma, MD      . Doug Sou Hold] pantoprazole (PROTONIX) injection 40 mg  40 mg Intravenous Teresita Madura, MD   40 mg at 12/21/18 2029    Allergies as of 12/21/2018 - Review Complete 12/21/2018  Allergen Reaction Noted  . Biaxin [clarithromycin]  12/12/2013  . Doxycycline Itching 09/13/2013  . Talwin [pentazocine] Other (See Comments) 09/13/2013    Family History  Problem Relation Age of Onset  . Hypertension Mother   . Cancer Father   . Stroke Paternal Grandmother   . Colon cancer Neg Hx     Social History    Socioeconomic History  . Marital status: Married    Spouse name: Not on file  . Number of children: Not on file  . Years of education: Not on file  . Highest education level: Not on file  Occupational History  . Not on file  Social Needs  . Financial resource strain: Not on file  . Food insecurity:    Worry: Not on file    Inability: Not on file  . Transportation needs:    Medical: Not on file    Non-medical: Not on file  Tobacco Use  . Smoking status: Never Smoker  . Smokeless tobacco: Never Used  Substance and Sexual Activity  .  Alcohol use: Yes    Alcohol/week: 14.0 standard drinks    Types: 14 Glasses of wine per week  . Drug use: No  . Sexual activity: Not on file  Lifestyle  . Physical activity:    Days per week: Not on file    Minutes per session: Not on file  . Stress: Not on file  Relationships  . Social connections:    Talks on phone: Not on file    Gets together: Not on file    Attends religious service: Not on file    Active member of club or organization: Not on file    Attends meetings of clubs or organizations: Not on file    Relationship status: Not on file  . Intimate partner violence:    Fear of current or ex partner: Not on file    Emotionally abused: Not on file    Physically abused: Not on file    Forced sexual activity: Not on file  Other Topics Concern  . Not on file  Social History Narrative  . Not on file    Review of Systems: Limited ROS as patient is lethargic from anesthesia, recovering in PACU. See HPI  Physical Exam: Vital signs in last 24 hours: Temp:  [97.9 F (36.6 C)-99.4 F (37.4 C)] 98.7 F (37.1 C) (03/20 1355) Pulse Rate:  [70-98] 98 (03/20 1400) Resp:  [11-20] 11 (03/20 1400) BP: (126-151)/(63-85) 149/71 (03/20 1400) SpO2:  [91 %-100 %] 100 % (03/20 1400) Weight:  [79.4 kg] 79.4 kg (03/20 1016) Last BM Date: 12/21/18 General:   Alert,  Well-developed, well-nourished, pleasant and cooperative in NAD Head:   Normocephalic and atraumatic. Eyes:  Sclera clear, no icterus.   Conjunctiva pink. Mouth:  No deformity or lesions.   Neck:  Supple; no masses or thyromegaly. Lungs: Clear Throughout Heart: .RRR, no murmur Abdomen: Laparoscopic incision intact, small amt blood oozing from site  Rectal:  Deferred  Msk:  Symmetrical without gross deformities. . Pulses:  Normal pulses noted. Extremities:  Without clubbing or edema. Neurologic:  Alert and  oriented x4;  grossly normal neurologically. Skin:  Intact without significant lesions or rashes.. Psych:  Alert and cooperative. Normal mood and affect.  Intake/Output from previous day: 03/19 0701 - 03/20 0700 In: 1695.1 [P.O.:200; I.V.:895.1; IV Piggyback:600] Out: 760 [Urine:760] Intake/Output this shift: Total I/O In: 1215 [I.V.:1215] Out: 275 [Urine:250; Blood:25]  Lab Results: Recent Labs    12/21/18 1446  WBC 8.3  HGB 13.3  HCT 40.2  PLT 288   BMET Recent Labs    12/21/18 1446  NA 137  K 4.7  CL 101  CO2 27  GLUCOSE 98  BUN 13  CREATININE 0.80  CALCIUM 8.7*   LFT Recent Labs    12/21/18 1446  PROT 7.1  ALBUMIN 4.1  AST 29  ALT 57*  ALKPHOS 108  BILITOT 1.4*     Studies/Results: Dg Cholangiogram Operative  Result Date: 12/22/2018 CLINICAL DATA:  Intraoperative cholangiogram. EXAM: INTRAOPERATIVE CHOLANGIOGRAM TECHNIQUE: Cholangiographic images from the C-arm fluoroscopic device were submitted for interpretation post-operatively. Please see the procedural report for the amount of contrast and the fluoroscopy time utilized. COMPARISON:  None. FINDINGS: Contrast fills the biliary tree and duodenum. There is a single filling defect in the common bile duct. Bile duct stone is not excluded. The common bile duct is dilated. IMPRESSION: See above. Electronically Signed   By: Marybelle Killings M.D.   On: 12/22/2018 13:28    IMPRESSION/PLAN:  1. 72  y.o. female with a hx of cholelithiasis scented to the ED with acute  cholecystitis, cholelithiasis and probable choledocholithiasis. No fever.  WBC 8.3.She is status post laparoscopic cholecystectomy. Intraoperative cholangiogram identified a single filling defect in the common bile duct, bile duct stone could not be excluded, the common bile duct was dilated. No immediate postoperative complications.  -CBC, CMP and Lipase level in a.m. -Plan for ERCP 12/23/2018 -post operative management per surgery   2. Elevated LFTs secondary to # 1. ALT 51. T. Bili 1.4.    Further recommendations per Dr. Phylliss Blakes Dorathy Daft  12/22/2018, 2:05 PM

## 2018-12-22 NOTE — Transfer of Care (Signed)
Immediate Anesthesia Transfer of Care Note  Patient: Beth Black  Procedure(s) Performed: LAPAROSCOPIC CHOLECYSTECTOMY WITH INTRAOPERATIVE CHOLANGIOGRAM (N/A Abdomen)  Patient Location: PACU  Anesthesia Type:General  Level of Consciousness: awake, drowsy and patient cooperative  Airway & Oxygen Therapy: Patient Spontanous Breathing and Patient connected to face mask oxygen  Post-op Assessment: Report given to RN and Post -op Vital signs reviewed and stable  Post vital signs: Reviewed and stable  Last Vitals:  Vitals Value Taken Time  BP    Temp    Pulse 96 12/22/2018  1:55 PM  Resp 11 12/22/2018  1:55 PM  SpO2 100 % 12/22/2018  1:55 PM  Vitals shown include unvalidated device data.  Last Pain:  Vitals:   12/22/18 1020  TempSrc: Oral  PainSc:       Patients Stated Pain Goal: 4 (33/43/56 8616)  Complications: No apparent anesthesia complications

## 2018-12-22 NOTE — Anesthesia Preprocedure Evaluation (Addendum)
Anesthesia Evaluation  Patient identified by MRN, date of birth, ID band Patient awake    Reviewed: Allergy & Precautions, NPO status , Patient's Chart, lab work & pertinent test results  History of Anesthesia Complications Negative for: history of anesthetic complications  Airway Mallampati: III  TM Distance: >3 FB Neck ROM: Full    Dental no notable dental hx.    Pulmonary neg pulmonary ROS,    Pulmonary exam normal        Cardiovascular negative cardio ROS Normal cardiovascular exam     Neuro/Psych PSYCHIATRIC DISORDERS Depression negative neurological ROS     GI/Hepatic Neg liver ROS, GERD  Medicated,  Endo/Other  negative endocrine ROS  Renal/GU negative Renal ROS  negative genitourinary   Musculoskeletal negative musculoskeletal ROS (+)   Abdominal   Peds  Hematology negative hematology ROS (+)   Anesthesia Other Findings   Reproductive/Obstetrics                            Anesthesia Physical Anesthesia Plan  ASA: II  Anesthesia Plan: General   Post-op Pain Management:    Induction: Intravenous and Rapid sequence  PONV Risk Score and Plan: 3 and Ondansetron, Dexamethasone and Treatment may vary due to age or medical condition  Airway Management Planned: Oral ETT  Additional Equipment: None  Intra-op Plan:   Post-operative Plan: Extubation in OR  Informed Consent: I have reviewed the patients History and Physical, chart, labs and discussed the procedure including the risks, benefits and alternatives for the proposed anesthesia with the patient or authorized representative who has indicated his/her understanding and acceptance.     Dental advisory given  Plan Discussed with:   Anesthesia Plan Comments:        Anesthesia Quick Evaluation

## 2018-12-22 NOTE — Progress Notes (Signed)
     Assessment & Plan: HD#2 biliary colic, cholecystitis, cholelithiasis  Pain improved with IV narcotics  Plan lap chole with IOC today  The risks and benefits of the procedure have been discussed at length with the patient.  The patient understands the proposed procedure, potential alternative treatments, and the course of recovery to be expected.  All of the patient's questions have been answered at this time.  The patient wishes to proceed with surgery.        Armandina Gemma, MD       [facility]       Office: [phone]   Chief Complaint: Abdominal pain  Subjective: Patient more comfortable this morning.  Husband at bedside.  Objective: Vital signs in last 24 hours: Temp:  [97.9 F (36.6 C)-99.4 F (37.4 C)] 99.4 F (37.4 C) (03/20 0433) Pulse Rate:  [70-80] 79 (03/20 0433) Resp:  [14-20] 16 (03/20 0433) BP: (126-164)/(71-90) 132/80 (03/20 0433) SpO2:  [91 %-100 %] 93 % (03/20 0433) Weight:  [80.2 kg] 80.2 kg (03/19 1403) Last BM Date: 12/21/18  Intake/Output from previous day: 03/19 0701 - 03/20 0700 In: 1695.1 [P.O.:200; I.V.:895.1; IV Piggyback:600] Out: 760 [Urine:760] Intake/Output this shift: No intake/output data recorded.  Physical Exam: HEENT - sclerae clear, mucous membranes moist Neck - soft Chest - clear bilaterally Cor - RRR Abdomen - soft, mild RUQ tenderness, no mass Ext - no edema, non-tender Neuro - alert & oriented, no focal deficits  Lab Results:  Recent Labs    12/21/18 1446  WBC 8.3  HGB 13.3  HCT 40.2  PLT 288   BMET Recent Labs    12/21/18 1446  NA 137  K 4.7  CL 101  CO2 27  GLUCOSE 98  BUN 13  CREATININE 0.80  CALCIUM 8.7*   PT/INR No results for input(s): LABPROT, INR in the last 72 hours. Comprehensive Metabolic Panel:    Component Value Date/Time   NA 137 12/21/2018 1446   NA 141 12/13/2018 0547   K 4.7 12/21/2018 1446   K 3.8 12/13/2018 0547   CL 101 12/21/2018 1446   CL 103  12/13/2018 0547   CO2 27 12/21/2018 1446   CO2 27 12/13/2018 0547   BUN 13 12/21/2018 1446   BUN 13 12/13/2018 0547   CREATININE 0.80 12/21/2018 1446   CREATININE 0.79 12/13/2018 0547   GLUCOSE 98 12/21/2018 1446   GLUCOSE 158 (H) 12/13/2018 0547   CALCIUM 8.7 (L) 12/21/2018 1446   CALCIUM 10.0 12/13/2018 0547   AST 29 12/21/2018 1446   AST 26 12/13/2018 0547   ALT 57 (H) 12/21/2018 1446   ALT 31 12/13/2018 0547   ALKPHOS 108 12/21/2018 1446   ALKPHOS 70 12/13/2018 0547   BILITOT 1.4 (H) 12/21/2018 1446   BILITOT 0.8 12/13/2018 0547   PROT 7.1 12/21/2018 1446   PROT 7.2 12/13/2018 0547   ALBUMIN 4.1 12/21/2018 1446   ALBUMIN 4.3 12/13/2018 0547    Studies/Results: No results found.    Armandina Gemma 12/22/2018

## 2018-12-22 NOTE — Op Note (Signed)
Procedure Note  Pre-operative Diagnosis:  Biliary colic, chronic cholecystitis, cholelithiasis  Post-operative Diagnosis:  Acute cholecystitis, cholelithiasis, choledocholithiasis  Surgeon:  Armandina Gemma, MD  Assistant:  Will Creig Hines, PA-C   Procedure:  Laparoscopic cholecystectomy with intra-operative cholangiography  Anesthesia:  General  Estimated Blood Loss:  minimal  Drains: 19Fr Blake drain to RUQ         Specimen: gallbladder to pathology  Indications:  Patient is a 73 year old female known to my surgical practice having seen my partner, Dr. Christie Beckers, for symptomatic cholelithiasis.  Patient had been scheduled for cholecystectomy in April.  Today she developed episodes of biliary colic which have been unrelenting since 6 AM this morning.  She denies nausea or vomiting.  She has had chills.  Patient presents to the emergency department.  Despite administration of intravenous narcotics, the patient continues to be symptomatic.  General surgery is called for evaluation.  Procedure Details:  The patient was seen in the pre-op holding area. The risks, benefits, complications, treatment options, and expected outcomes were previously discussed with the patient. The patient agreed with the proposed plan and has signed the informed consent form.  The patient was transported to operating room # 2 at the [facility]. The patient was placed in the supine position on the operating room table. Following induction of general anesthesia, the abdomen was prepped and draped in the usual aseptic fashion.  An incision was made in the skin near the umbilicus. The midline fascia was incised and the peritoneal cavity was entered and a Hasson cannula was introduced under direct vision. The cannula was secured with a 0-Vicryl pursestring suture. Pneumoperitoneum was established with carbon dioxide. Additional cannulae were introduced under direct vision along the right costal margin in the  midline, mid-clavicular line, and anterior axillary line.   The gallbladder was identified. It was markedly distended and inflamed with omental adhesions.  Adhesions were mobilized and the gallbladder was aspirated with a trocar.  Brownish foul-smelling fluid was evacuated and submitted to pathology for culture.  The fundus grasped and retracted cephalad. Adhesions were taken down bluntly and the electrocautery was utilized as needed, taking care not to involve any adjacent structures. The infundibulum was grasped and retracted laterally, exposing the peritoneum overlying the triangle of Calot. The peritoneum was incised and structures exposed with blunt dissection. The cystic duct was markedly dilated.  It appeared to contain multiple gallstones.  It was dissected out at the junction with the gallbladder wall and opened.  Stones were extracted and removed.  The cholangiogram catheter introduced. The catheter was secured using an ligaclip.  Real-time cholangiography was performed using C-arm fluoroscopy.  There was rapid filling of a dilated common bile duct.  There was reflux of contrast into the left and right hepatic ductal systems which were also dilated.  There were filling defects in the cystic duct and CBD.  The cystic duct was doubly clipped and divided.  The cystic artery was identified, dissected circumferentially, ligated with ligaclips, and divided.  The gallbladder was dissected away from the gallbladder bed using the electrocautery for hemostasis. The gallbladder was completely removed from the liver and placed into an endocatch bag. The gallbladder was removed in the endocatch bag through the umbilical port site and submitted to pathology for review.  The right upper quadrant was irrigated and the gallbladder bed was inspected. Hemostasis was achieved with the electrocautery.  A 19Fr Blake drain was placed in the RUQ and brought out through the later port site.  It was secured to the skin with a  2-0 Nylon suture.  It was placed to bulb suction.  Cannulae were removed under direct vision and good hemostasis was noted. Pneumoperitoneum was released and the majority of the carbon dioxide evacuated. The umbilical wound was irrigated and the fascia was then closed with the pursestring suture.  Local anesthetic was infiltrated at all port sites. Skin incisions were closed with 4-0 Monocril subcuticular sutures and Dermabond was applied.  Instrument, sponge, and needle counts were correct at the conclusion of the case.  The patient was awakened from anesthesia and brought to the recovery room in stable condition.  The patient tolerated the procedure well.   Armandina Gemma, MD [facility] Office: [phone]

## 2018-12-22 NOTE — Anesthesia Procedure Notes (Addendum)
Procedure Name: Intubation Date/Time: 12/22/2018 12:08 PM Performed by: Eben Burow, CRNA Pre-anesthesia Checklist: Patient identified, Emergency Drugs available, Suction available, Patient being monitored and Timeout performed Patient Re-evaluated:Patient Re-evaluated prior to induction Oxygen Delivery Method: Circle system utilized Preoxygenation: Pre-oxygenation with 100% oxygen Induction Type: IV induction and Rapid sequence Laryngoscope Size: Mac and 4 Grade View: Grade II Tube type: Oral Tube size: 7.0 mm Number of attempts: 1 Airway Equipment and Method: Stylet Placement Confirmation: ETT inserted through vocal cords under direct vision,  positive ETCO2 and breath sounds checked- equal and bilateral Secured at: 21 cm Tube secured with: Tape Dental Injury: Teeth and Oropharynx as per pre-operative assessment

## 2018-12-23 DIAGNOSIS — Z79899 Other long term (current) drug therapy: Secondary | ICD-10-CM | POA: Diagnosis not present

## 2018-12-23 DIAGNOSIS — F329 Major depressive disorder, single episode, unspecified: Secondary | ICD-10-CM | POA: Diagnosis present

## 2018-12-23 DIAGNOSIS — Z823 Family history of stroke: Secondary | ICD-10-CM | POA: Diagnosis not present

## 2018-12-23 DIAGNOSIS — Z888 Allergy status to other drugs, medicaments and biological substances status: Secondary | ICD-10-CM | POA: Diagnosis not present

## 2018-12-23 DIAGNOSIS — E785 Hyperlipidemia, unspecified: Secondary | ICD-10-CM | POA: Diagnosis present

## 2018-12-23 DIAGNOSIS — K8010 Calculus of gallbladder with chronic cholecystitis without obstruction: Secondary | ICD-10-CM | POA: Diagnosis present

## 2018-12-23 DIAGNOSIS — Z8249 Family history of ischemic heart disease and other diseases of the circulatory system: Secondary | ICD-10-CM | POA: Diagnosis not present

## 2018-12-23 DIAGNOSIS — K8051 Calculus of bile duct without cholangitis or cholecystitis with obstruction: Secondary | ICD-10-CM | POA: Diagnosis not present

## 2018-12-23 DIAGNOSIS — Z881 Allergy status to other antibiotic agents status: Secondary | ICD-10-CM | POA: Diagnosis not present

## 2018-12-23 DIAGNOSIS — K8065 Calculus of gallbladder and bile duct with chronic cholecystitis with obstruction: Secondary | ICD-10-CM | POA: Diagnosis present

## 2018-12-23 LAB — CBC WITH DIFFERENTIAL/PLATELET
ABS IMMATURE GRANULOCYTES: 0.06 10*3/uL (ref 0.00–0.07)
Basophils Absolute: 0 10*3/uL (ref 0.0–0.1)
Basophils Relative: 0 %
Eosinophils Absolute: 0 10*3/uL (ref 0.0–0.5)
Eosinophils Relative: 0 %
HCT: 35.2 % — ABNORMAL LOW (ref 36.0–46.0)
Hemoglobin: 11.5 g/dL — ABNORMAL LOW (ref 12.0–15.0)
IMMATURE GRANULOCYTES: 1 %
Lymphocytes Relative: 9 %
Lymphs Abs: 0.9 10*3/uL (ref 0.7–4.0)
MCH: 31.4 pg (ref 26.0–34.0)
MCHC: 32.7 g/dL (ref 30.0–36.0)
MCV: 96.2 fL (ref 80.0–100.0)
Monocytes Absolute: 0.7 10*3/uL (ref 0.1–1.0)
Monocytes Relative: 7 %
NEUTROS ABS: 8.4 10*3/uL — AB (ref 1.7–7.7)
NEUTROS PCT: 83 %
Platelets: 245 10*3/uL (ref 150–400)
RBC: 3.66 MIL/uL — ABNORMAL LOW (ref 3.87–5.11)
RDW: 11.9 % (ref 11.5–15.5)
WBC: 10 10*3/uL (ref 4.0–10.5)
nRBC: 0 % (ref 0.0–0.2)

## 2018-12-23 LAB — COMPREHENSIVE METABOLIC PANEL
ALT: 47 U/L — ABNORMAL HIGH (ref 0–44)
AST: 41 U/L (ref 15–41)
Albumin: 3.3 g/dL — ABNORMAL LOW (ref 3.5–5.0)
Alkaline Phosphatase: 77 U/L (ref 38–126)
Anion gap: 7 (ref 5–15)
BUN: 7 mg/dL — ABNORMAL LOW (ref 8–23)
CO2: 25 mmol/L (ref 22–32)
Calcium: 8.4 mg/dL — ABNORMAL LOW (ref 8.9–10.3)
Chloride: 107 mmol/L (ref 98–111)
Creatinine, Ser: 0.61 mg/dL (ref 0.44–1.00)
GFR calc non Af Amer: >60 mL/min (ref >60)
Glucose, Bld: 153 mg/dL — ABNORMAL HIGH (ref 70–99)
Potassium: 4.4 mmol/L (ref 3.5–5.1)
Sodium: 139 mmol/L (ref 135–145)
Total Bilirubin: 0.8 mg/dL (ref 0.3–1.2)
Total Protein: 6.3 g/dL — ABNORMAL LOW (ref 6.5–8.1)

## 2018-12-23 LAB — LIPASE, BLOOD: LIPASE: 23 U/L (ref 11–51)

## 2018-12-23 LAB — CANCER ANTIGEN 19-9: CAN 19-9: 9 U/mL (ref 0–35)

## 2018-12-23 NOTE — Progress Notes (Signed)
1 Day Post-Op  Subjective: Stable and alert.  Good pain control. Seen by Dr. Nicki Guadalajara of low [facility] gastroenterology Dr. Lyndel Safe to assume GI care today MRCP done and confirms choledocholithiasis Presume she will have ERCP today  Lab work reveals mild elevation of transaminases but normal bilirubin.  Hemoglobin 11.5.  WBC 10,000.    Objective: Vital signs in last 24 hours: Temp:  [97.8 F (36.6 C)-99 F (37.2 C)] 98.1 F (36.7 C) (03/21 0505) Pulse Rate:  [72-99] 72 (03/21 0505) Resp:  [10-18] 16 (03/21 0505) BP: (129-154)/(60-81) 135/72 (03/21 0505) SpO2:  [92 %-100 %] 96 % (03/21 0505) Weight:  [79.4 kg] 79.4 kg (03/20 1016) Last BM Date: 12/21/18  Intake/Output from previous day: 03/20 0701 - 03/21 0700 In: 3323.8 [P.O.:360; I.V.:2863.8; IV Piggyback:100] Out: 2450 [Urine:2300; Drains:125; Blood:25] Intake/Output this shift: No intake/output data recorded.  General appearance: Alert.  Minimal distress.  Mental status normal Resp: clear to auscultation bilaterally GI: Abdomen soft.  Nondistended.  Wounds look fine.  Appropriate incisional tenderness.  Lab Results:  Results for orders placed or performed during the hospital encounter of 12/21/18 (from the past 24 hour(s))  Aerobic/Anaerobic Culture (surgical/deep wound)     Status: None (Preliminary result)   Collection Time: 12/22/18 12:43 PM  Result Value Ref Range   Specimen Description      GALL BLADDER Performed at [facility] [address], [place], [place] [address]    Special Requests      NONE Performed at [facility], [place] [address] [place], [place] [address]    Gram Stain      FEW WBC PRESENT,BOTH PMN AND MONONUCLEAR RARE GRAM POSITIVE COCCI Performed at [facility], [place] [address] [place], [place] [address]    Culture PENDING    Report Status PENDING   Cancer antigen 19-9     Status: None   Collection Time: 12/22/18  5:22 PM  Result Value  Ref Range   CA 19-9 9 0 - 35 U/mL  CBC with Differential     Status: Abnormal   Collection Time: 12/23/18  4:25 AM  Result Value Ref Range   WBC 10.0 4.0 - 10.5 K/uL   RBC 3.66 (L) 3.87 - 5.11 MIL/uL   Hemoglobin 11.5 (L) 12.0 - 15.0 g/dL   HCT 35.2 (L) 36.0 - 46.0 %   MCV 96.2 80.0 - 100.0 fL   MCH 31.4 26.0 - 34.0 pg   MCHC 32.7 30.0 - 36.0 g/dL   RDW 11.9 11.5 - 15.5 %   Platelets 245 150 - 400 K/uL   nRBC 0.0 0.0 - 0.2 %   Neutrophils Relative % 83 %   Neutro Abs 8.4 (H) 1.7 - 7.7 K/uL   Lymphocytes Relative 9 %   Lymphs Abs 0.9 0.7 - 4.0 K/uL   Monocytes Relative 7 %   Monocytes Absolute 0.7 0.1 - 1.0 K/uL   Eosinophils Relative 0 %   Eosinophils Absolute 0.0 0.0 - 0.5 K/uL   Basophils Relative 0 %   Basophils Absolute 0.0 0.0 - 0.1 K/uL   Immature Granulocytes 1 %   Abs Immature Granulocytes 0.06 0.00 - 0.07 K/uL  Comprehensive metabolic panel Once     Status: Abnormal   Collection Time: 12/23/18  4:25 AM  Result Value Ref Range   Sodium 139 135 - 145 mmol/L   Potassium 4.4 3.5 - 5.1 mmol/L   Chloride 107 98 - 111 mmol/L   CO2 25  22 - 32 mmol/L   Glucose, Bld 153 (H) 70 - 99 mg/dL   BUN 7 (L) 8 - 23 mg/dL   Creatinine, Ser 0.61 0.44 - 1.00 mg/dL   Calcium 8.4 (L) 8.9 - 10.3 mg/dL   Total Protein 6.3 (L) 6.5 - 8.1 g/dL   Albumin 3.3 (L) 3.5 - 5.0 g/dL   AST 41 15 - 41 U/L   ALT 47 (H) 0 - 44 U/L   Alkaline Phosphatase 77 38 - 126 U/L   Total Bilirubin 0.8 0.3 - 1.2 mg/dL   GFR calc non Af Amer >60 >60 mL/min   GFR calc Af Amer >60 >60 mL/min   Anion gap 7 5 - 15  Lipase, blood     Status: None   Collection Time: 12/23/18  4:25 AM  Result Value Ref Range   Lipase 23 11 - 51 U/L     Studies/Results: Dg Cholangiogram Operative  Result Date: 12/22/2018 CLINICAL DATA:  Intraoperative cholangiogram. EXAM: INTRAOPERATIVE CHOLANGIOGRAM TECHNIQUE: Cholangiographic images from the C-arm fluoroscopic device were submitted for interpretation post-operatively. Please  see the procedural report for the amount of contrast and the fluoroscopy time utilized. COMPARISON:  None. FINDINGS: Contrast fills the biliary tree and duodenum. There is a single filling defect in the common bile duct. Bile duct stone is not excluded. The common bile duct is dilated. IMPRESSION: See above. Electronically Signed   By: Marybelle Killings M.D.   On: 12/22/2018 13:28   Mr Abdomen Mrcp Wo Contrast  Result Date: 12/23/2018 CLINICAL DATA:  73 year old female with history of right upper quadrant abdominal pain. Evaluate for common bile duct stones. EXAM: MRI ABDOMEN WITHOUT CONTRAST  (INCLUDING MRCP) TECHNIQUE: Multiplanar multisequence MR imaging of the abdomen was performed. Heavily T2-weighted images of the biliary and pancreatic ducts were obtained, and three-dimensional MRCP images were rendered by post processing. COMPARISON:  No priors.  Abdominal ultrasound 12/13/2018. FINDINGS: Lower chest: Unremarkable. Hepatobiliary: No suspicious cystic or solid hepatic lesions. No intra or extrahepatic biliary ductal dilatation noted on MRCP images. No intrahepatic biliary ductal dilatation noted on MRCP images. Common bile duct is mildly dilated measuring 7 x 11 mm (mean diameter of 9 mm) in the porta hepatis. In the distal common bile duct there is a 4 mm filling defect (coronal MRCP image 90 of series 4), compatible with choledocholithiasis. Patient is status post cholecystectomy. Trace amount of T2 signal intensity in the gallbladder fossa, presumably a trace amount of postoperative fluid in this patient status post cholecystectomy 1 day ago. Pancreas: No pancreatic mass. No pancreatic ductal dilatation noted on MRCP images. No pancreatic or peripancreatic fluid collections. Trace amount of T2 signal intensity adjacent to the head of the pancreas and the surrounding duodenum, favored to be related to recent cholecystectomy. Spleen:  Unremarkable. Adrenals/Urinary Tract: Bilateral kidneys and adrenal glands  are normal in appearance. No hydroureteronephrosis in the visualized portions of the abdomen. Stomach/Bowel: Stomach is normal in appearance. Trace amount of T2 signal intensity adjacent to the duodenum, presumably related to recent cholecystectomy. Remaining portions are unremarkable. Vascular/Lymphatic: No aneurysm identified in the visualized abdominal vasculature. No lymphadenopathy noted in the visualized abdomen. Other: Trace amount of T2 signal intensity adjacent to the duodenum, anterior to the right kidney, and inferior to the gallbladder fossa in the liver, presumably postoperative fluid. Surgical drain in position in the right subhepatic region. Musculoskeletal: No aggressive appearing osseous lesions are noted in the visualized portions of the skeleton. IMPRESSION: 1. 4 mm choledocholithiasis in the  distal common bile duct. This is associated with mild common bile duct dilatation (mean diameter of 9 mm). 2. Postoperative changes of recent cholecystectomy with trace amount of fluid in the cholecystectomy bed and surrounding areas, and surgical drain in position in the right subhepatic region, as above. Electronically Signed   By: Vinnie Langton M.D.   On: 12/23/2018 04:29   Mr 3d Recon At Scanner  Result Date: 12/23/2018 CLINICAL DATA:  73 year old female with history of right upper quadrant abdominal pain. Evaluate for common bile duct stones. EXAM: MRI ABDOMEN WITHOUT CONTRAST  (INCLUDING MRCP) TECHNIQUE: Multiplanar multisequence MR imaging of the abdomen was performed. Heavily T2-weighted images of the biliary and pancreatic ducts were obtained, and three-dimensional MRCP images were rendered by post processing. COMPARISON:  No priors.  Abdominal ultrasound 12/13/2018. FINDINGS: Lower chest: Unremarkable. Hepatobiliary: No suspicious cystic or solid hepatic lesions. No intra or extrahepatic biliary ductal dilatation noted on MRCP images. No intrahepatic biliary ductal dilatation noted on MRCP  images. Common bile duct is mildly dilated measuring 7 x 11 mm (mean diameter of 9 mm) in the porta hepatis. In the distal common bile duct there is a 4 mm filling defect (coronal MRCP image 90 of series 4), compatible with choledocholithiasis. Patient is status post cholecystectomy. Trace amount of T2 signal intensity in the gallbladder fossa, presumably a trace amount of postoperative fluid in this patient status post cholecystectomy 1 day ago. Pancreas: No pancreatic mass. No pancreatic ductal dilatation noted on MRCP images. No pancreatic or peripancreatic fluid collections. Trace amount of T2 signal intensity adjacent to the head of the pancreas and the surrounding duodenum, favored to be related to recent cholecystectomy. Spleen:  Unremarkable. Adrenals/Urinary Tract: Bilateral kidneys and adrenal glands are normal in appearance. No hydroureteronephrosis in the visualized portions of the abdomen. Stomach/Bowel: Stomach is normal in appearance. Trace amount of T2 signal intensity adjacent to the duodenum, presumably related to recent cholecystectomy. Remaining portions are unremarkable. Vascular/Lymphatic: No aneurysm identified in the visualized abdominal vasculature. No lymphadenopathy noted in the visualized abdomen. Other: Trace amount of T2 signal intensity adjacent to the duodenum, anterior to the right kidney, and inferior to the gallbladder fossa in the liver, presumably postoperative fluid. Surgical drain in position in the right subhepatic region. Musculoskeletal: No aggressive appearing osseous lesions are noted in the visualized portions of the skeleton. IMPRESSION: 1. 4 mm choledocholithiasis in the distal common bile duct. This is associated with mild common bile duct dilatation (mean diameter of 9 mm). 2. Postoperative changes of recent cholecystectomy with trace amount of fluid in the cholecystectomy bed and surrounding areas, and surgical drain in position in the right subhepatic region, as  above. Electronically Signed   By: Vinnie Langton M.D.   On: 12/23/2018 04:29    . acetaminophen  1,000 mg Oral Q8H  . cycloSPORINE  1 drop Both Eyes BID  . escitalopram  20 mg Oral Daily  . pantoprazole (PROTONIX) IV  40 mg Intravenous QHS     Assessment/Plan: s/p Procedure(s): LAPAROSCOPIC CHOLECYSTECTOMY WITH INTRAOPERATIVE CHOLANGIOGRAM  POD #1.  Laparoscopic cholecystectomy with cholangiogram Stable surgically N.p.o. with ERCP anticipated due to common bile duct stones demonstrated both on IOC and MRCP  Choledocholithiasis.  Await ERCP  @PROBHOSP @  LOS: 0 days    Adin Hector 12/23/2018  . .prob

## 2018-12-23 NOTE — Plan of Care (Signed)
Patient lying in bed this morning. No complaints of pain currently. Has been up ambulating in room this morning. Will continue to monitor.

## 2018-12-23 NOTE — Progress Notes (Signed)
IMPRESSION and PLAN:    #1.  Choledocholithiasis with mildly abn LFTs.  No ascending cholangitis or pancreatitis #2.  Biliary colic status post lap chole with IOC Plan: -ERCP Monday (due to scheduling issues)-scheduled for 8 AM -Continue supportive treatment. -Trend CBC, LFTs and lipase.           Beth Black is a 73 y.o. female  Doing very well No abdominal pain No nausea or vomiting Status post lap chole with IOC showing significantly dilated duct with CBD stone MRCP confirms CBD stone.  No pancreatic masses Awaiting ERCP   Past Medical History:  Diagnosis Date   Depression    Hyperlipidemia     Current Facility-Administered Medications  Medication Dose Route Frequency Provider Last Rate Last Dose   acetaminophen (TYLENOL) tablet 1,000 mg  1,000 mg Oral Q8H Earnstine Regal, PA-C   1,000 mg at 12/23/18 0940   cefTRIAXone (ROCEPHIN) 2 g in sodium chloride 0.9 % 100 mL IVPB  2 g Intravenous Q24H Earnstine Regal, PA-C 200 mL/hr at 12/22/18 2336 2 g at 12/22/18 2336   cycloSPORINE (RESTASIS) 0.05 % ophthalmic emulsion 1 drop  1 drop Both Eyes BID Earnstine Regal, PA-C   1 drop at 12/23/18 0945   dextrose 5 % and 0.45 % NaCl with KCl 20 mEq/L infusion   Intravenous Continuous Earnstine Regal, PA-C 100 mL/hr at 12/23/18 1430     escitalopram (LEXAPRO) tablet 20 mg  20 mg Oral Daily Earnstine Regal, PA-C   20 mg at 12/23/18 8144   HYDROmorphone (DILAUDID) injection 1 mg  1 mg Intravenous Q2H PRN Earnstine Regal, PA-C   1 mg at 12/21/18 2338   ondansetron (ZOFRAN-ODT) disintegrating tablet 4 mg  4 mg Oral Q6H PRN Earnstine Regal, PA-C       Or   ondansetron [facility]) injection 4 mg  4 mg Intravenous Q6H PRN Earnstine Regal, PA-C       oxyCODONE (Oxy IR/ROXICODONE) immediate release tablet 5-10 mg  5-10 mg Oral Q4H PRN Earnstine Regal, PA-C       pantoprazole (PROTONIX) injection 40 mg  40 mg Intravenous QHS Earnstine Regal, PA-C    40 mg at 12/22/18 2156    Past Surgical History:  Procedure Laterality Date   TOE SURGERY Bilateral 1990   5th toes    Family History  Problem Relation Age of Onset   Hypertension Mother    Cancer Father    Stroke Paternal Grandmother    Colon cancer Neg Hx     Social History   Tobacco Use   Smoking status: Never Smoker   Smokeless tobacco: Never Used  Substance Use Topics   Alcohol use: Yes    Alcohol/week: 14.0 standard drinks    Types: 14 Glasses of wine per week   Drug use: No    Allergies  Allergen Reactions   Biaxin [Clarithromycin]     Feels "jittery"   Doxycycline Itching   Talwin [Pentazocine] Other (See Comments)    Hallucination      Review of Systems: All systems reviewed and negative except where noted in HPI.    Physical Exam:     BP 128/66    Pulse 68    Temp 98.4 F (36.9 C) (Oral)    Resp 15    Ht 5\' 5"  (1.651 m)    Wt 79.4 kg    SpO2 98%    BMI 29.12 kg/m  @WEIGHTLAST3 @ GENERAL:  Alert, oriented, cooperative, not in acute distress.  PSYCH: :Pleasant, normal mood and affect. HEENT:  conjunctiva pink, mucous membranes moist, neck supple without masses. No jaundice. CARDIAC:  S1 S2 normal. No murmers. PULM: Normal respiratory effort, lungs CTA bilaterally, no wheezing. ABDOMEN: Inspection: No visible peristalsis, no abnormal pulsations, skin normal.  Palpation/percussion: Soft, nontender, nondistended, no rigidity, no abnormal dullness to percussion, no hepatosplenomegaly and no palpable abdominal masses.  Auscultation: Normal bowel sounds, no abdominal bruits. Rectal exam: Deferred SKIN:  turgor, no lesions seen. Musculoskeletal:  Normal muscle tone, normal strength. NEURO: Alert and oriented x 3, no focal neurologic deficits.   Data Reviewed: I have personally reviewed following labs and imaging studies  CBC: Recent Labs  Lab 12/21/18 1446 12/23/18 0425  WBC 8.3 10.0  NEUTROABS  --  8.4*  HGB 13.3 11.5*  HCT 40.2  35.2*  MCV 93.3 96.2  PLT 288 657   Basic Metabolic Panel: Recent Labs  Lab 12/21/18 1446 12/23/18 0425  NA 137 139  K 4.7 4.4  CL 101 107  CO2 27 25  GLUCOSE 98 153*  BUN 13 7*  CREATININE 0.80 0.61  CALCIUM 8.7* 8.4*   GFR: Estimated Creatinine Clearance: 67.2 mL/min (by C-G formula based on SCr of 0.61 mg/dL). Liver Function Tests: Recent Labs  Lab 12/21/18 1446 12/23/18 0425  AST 29 41  ALT 57* 47*  ALKPHOS 108 77  BILITOT 1.4* 0.8  PROT 7.1 6.3*  ALBUMIN 4.1 3.3*   Recent Labs  Lab 12/21/18 1446 12/23/18 0425  LIPASE 48 23    Recent Results (from the past 240 hour(s))  Surgical pcr screen     Status: None   Collection Time: 12/22/18  1:16 AM  Result Value Ref Range Status   MRSA, PCR NEGATIVE NEGATIVE Final   Staphylococcus aureus NEGATIVE NEGATIVE Final    Comment: (NOTE) The Xpert SA Assay (FDA approved for NASAL specimens in patients 50 years of age and older), is one component of a comprehensive surveillance program. It is not intended to diagnose infection nor to guide or monitor treatment. Performed at [facility], [place] [address], [place], [place] [address]   Aerobic/Anaerobic Culture (surgical/deep wound)     Status: None (Preliminary result)   Collection Time: 12/22/18 12:43 PM  Result Value Ref Range Status   Specimen Description   Final    GALL BLADDER Performed at [employer] [address] [place], [place] [address]    Special Requests   Final    NONE Performed at [facility], [place], [place] [address] [place], [place] [address]    Gram Stain   Final    FEW WBC PRESENT,BOTH PMN AND MONONUCLEAR RARE GRAM POSITIVE COCCI    Culture   Final    CULTURE REINCUBATED FOR BETTER GROWTH Performed at [facility], [place] [address], [place], [place] [address]    Report Status PENDING  Incomplete      Radiology Studies: Dg Abd 1 View  Result Date: 12/13/2018 CLINICAL DATA:  73  y/o F; colonoscopy and endoscopy 12/12/2018. Increasing abdominal pain and vomiting. EXAM: ABDOMEN - 1 VIEW COMPARISON:  None. FINDINGS: The bowel gas pattern is normal. No radio-opaque calculi or other significant radiographic abnormality are seen. IMPRESSION: Normal bowel gas pattern. Electronically Signed   By: Kristine Garbe M.D.   On: 12/13/2018 06:11   Dg Cholangiogram Operative  Result Date: 12/22/2018 CLINICAL DATA:  Intraoperative cholangiogram. EXAM: INTRAOPERATIVE CHOLANGIOGRAM TECHNIQUE: Cholangiographic images from the C-arm fluoroscopic device were submitted for  interpretation post-operatively. Please see the procedural report for the amount of contrast and the fluoroscopy time utilized. COMPARISON:  None. FINDINGS: Contrast fills the biliary tree and duodenum. There is a single filling defect in the common bile duct. Bile duct stone is not excluded. The common bile duct is dilated. IMPRESSION: See above. Electronically Signed   By: Marybelle Killings M.D.   On: 12/22/2018 13:28   Mr Abdomen Mrcp Wo Contrast  Result Date: 12/23/2018 CLINICAL DATA:  73 year old female with history of right upper quadrant abdominal pain. Evaluate for common bile duct stones. EXAM: MRI ABDOMEN WITHOUT CONTRAST  (INCLUDING MRCP) TECHNIQUE: Multiplanar multisequence MR imaging of the abdomen was performed. Heavily T2-weighted images of the biliary and pancreatic ducts were obtained, and three-dimensional MRCP images were rendered by post processing. COMPARISON:  No priors.  Abdominal ultrasound 12/13/2018. FINDINGS: Lower chest: Unremarkable. Hepatobiliary: No suspicious cystic or solid hepatic lesions. No intra or extrahepatic biliary ductal dilatation noted on MRCP images. No intrahepatic biliary ductal dilatation noted on MRCP images. Common bile duct is mildly dilated measuring 7 x 11 mm (mean diameter of 9 mm) in the porta hepatis. In the distal common bile duct there is a 4 mm filling defect (coronal MRCP  image 90 of series 4), compatible with choledocholithiasis. Patient is status post cholecystectomy. Trace amount of T2 signal intensity in the gallbladder fossa, presumably a trace amount of postoperative fluid in this patient status post cholecystectomy 1 day ago. Pancreas: No pancreatic mass. No pancreatic ductal dilatation noted on MRCP images. No pancreatic or peripancreatic fluid collections. Trace amount of T2 signal intensity adjacent to the head of the pancreas and the surrounding duodenum, favored to be related to recent cholecystectomy. Spleen:  Unremarkable. Adrenals/Urinary Tract: Bilateral kidneys and adrenal glands are normal in appearance. No hydroureteronephrosis in the visualized portions of the abdomen. Stomach/Bowel: Stomach is normal in appearance. Trace amount of T2 signal intensity adjacent to the duodenum, presumably related to recent cholecystectomy. Remaining portions are unremarkable. Vascular/Lymphatic: No aneurysm identified in the visualized abdominal vasculature. No lymphadenopathy noted in the visualized abdomen. Other: Trace amount of T2 signal intensity adjacent to the duodenum, anterior to the right kidney, and inferior to the gallbladder fossa in the liver, presumably postoperative fluid. Surgical drain in position in the right subhepatic region. Musculoskeletal: No aggressive appearing osseous lesions are noted in the visualized portions of the skeleton. IMPRESSION: 1. 4 mm choledocholithiasis in the distal common bile duct. This is associated with mild common bile duct dilatation (mean diameter of 9 mm). 2. Postoperative changes of recent cholecystectomy with trace amount of fluid in the cholecystectomy bed and surrounding areas, and surgical drain in position in the right subhepatic region, as above. Electronically Signed   By: Vinnie Langton M.D.   On: 12/23/2018 04:29   Mr 3d Recon At Scanner  Result Date: 12/23/2018 CLINICAL DATA:  74 year old female with history of  right upper quadrant abdominal pain. Evaluate for common bile duct stones. EXAM: MRI ABDOMEN WITHOUT CONTRAST  (INCLUDING MRCP) TECHNIQUE: Multiplanar multisequence MR imaging of the abdomen was performed. Heavily T2-weighted images of the biliary and pancreatic ducts were obtained, and three-dimensional MRCP images were rendered by post processing. COMPARISON:  No priors.  Abdominal ultrasound 12/13/2018. FINDINGS: Lower chest: Unremarkable. Hepatobiliary: No suspicious cystic or solid hepatic lesions. No intra or extrahepatic biliary ductal dilatation noted on MRCP images. No intrahepatic biliary ductal dilatation noted on MRCP images. Common bile duct is mildly dilated measuring 7 x 11 mm (mean diameter  of 9 mm) in the porta hepatis. In the distal common bile duct there is a 4 mm filling defect (coronal MRCP image 90 of series 4), compatible with choledocholithiasis. Patient is status post cholecystectomy. Trace amount of T2 signal intensity in the gallbladder fossa, presumably a trace amount of postoperative fluid in this patient status post cholecystectomy 1 day ago. Pancreas: No pancreatic mass. No pancreatic ductal dilatation noted on MRCP images. No pancreatic or peripancreatic fluid collections. Trace amount of T2 signal intensity adjacent to the head of the pancreas and the surrounding duodenum, favored to be related to recent cholecystectomy. Spleen:  Unremarkable. Adrenals/Urinary Tract: Bilateral kidneys and adrenal glands are normal in appearance. No hydroureteronephrosis in the visualized portions of the abdomen. Stomach/Bowel: Stomach is normal in appearance. Trace amount of T2 signal intensity adjacent to the duodenum, presumably related to recent cholecystectomy. Remaining portions are unremarkable. Vascular/Lymphatic: No aneurysm identified in the visualized abdominal vasculature. No lymphadenopathy noted in the visualized abdomen. Other: Trace amount of T2 signal intensity adjacent to the  duodenum, anterior to the right kidney, and inferior to the gallbladder fossa in the liver, presumably postoperative fluid. Surgical drain in position in the right subhepatic region. Musculoskeletal: No aggressive appearing osseous lesions are noted in the visualized portions of the skeleton. IMPRESSION: 1. 4 mm choledocholithiasis in the distal common bile duct. This is associated with mild common bile duct dilatation (mean diameter of 9 mm). 2. Postoperative changes of recent cholecystectomy with trace amount of fluid in the cholecystectomy bed and surrounding areas, and surgical drain in position in the right subhepatic region, as above. Electronically Signed   By: Vinnie Langton M.D.   On: 12/23/2018 04:29   US Abdomen Limited Ruq  Addendum Date: 12/13/2018   ADDENDUM REPORT: 12/13/2018 06:10 ADDENDUM: Please excuse dictation error: There are multiple small layering gallbladder calculi with no gallbladder wall thickening or focal tenderness. No renal calculi were visualized. Electronically Signed   By: [place] Fantasia M.D.   On: 12/13/2018 06:10   Result Date: 12/13/2018 CLINICAL DATA:  Epigastric pain EXAM: ULTRASOUND ABDOMEN LIMITED RIGHT UPPER QUADRANT COMPARISON:  None. FINDINGS: Gallbladder: Multiple small layering wall thickening calculi or focal tenderness. No gallbladder Common bile duct: Limited visualization. Diameter: 3 mm. Where visualized, no filling defect. Liver: No focal lesion identified. Within normal limits in parenchymal echogenicity. Portal vein is patent on color Doppler imaging with normal direction of blood flow towards the liver. IMPRESSION: Multiple small renal calculi.  No evidence of acute cholecystitis. Electronically Signed: By: Monte Fantasia M.D. On: 12/13/2018 06:01      Bridgitte Felicetti,MD 12/23/2018, 3:23 PM   CC No ref. provider found

## 2018-12-23 NOTE — H&P (View-Only) (Signed)
IMPRESSION and PLAN:    #1.  Choledocholithiasis with mildly abn LFTs.  No ascending cholangitis or pancreatitis #2.  Biliary colic status post lap chole with IOC Plan: -ERCP Monday (due to scheduling issues)-scheduled for 8 AM -Continue supportive treatment. -Trend CBC, LFTs and lipase.           Beth Black is a 73 y.o. female  Doing very well No abdominal pain No nausea or vomiting Status post lap chole with IOC showing significantly dilated duct with CBD stone MRCP confirms CBD stone.  No pancreatic masses Awaiting ERCP   Past Medical History:  Diagnosis Date   Depression    Hyperlipidemia     Current Facility-Administered Medications  Medication Dose Route Frequency Provider Last Rate Last Dose   acetaminophen (TYLENOL) tablet 1,000 mg  1,000 mg Oral Q8H Earnstine Regal, PA-C   1,000 mg at 12/23/18 0940   cefTRIAXone (ROCEPHIN) 2 g in sodium chloride 0.9 % 100 mL IVPB  2 g Intravenous Q24H Earnstine Regal, PA-C 200 mL/hr at 12/22/18 2336 2 g at 12/22/18 2336   cycloSPORINE (RESTASIS) 0.05 % ophthalmic emulsion 1 drop  1 drop Both Eyes BID Earnstine Regal, PA-C   1 drop at 12/23/18 0945   dextrose 5 % and 0.45 % NaCl with KCl 20 mEq/L infusion   Intravenous Continuous Earnstine Regal, PA-C 100 mL/hr at 12/23/18 1430     escitalopram (LEXAPRO) tablet 20 mg  20 mg Oral Daily Earnstine Regal, PA-C   20 mg at 12/23/18 2841   HYDROmorphone (DILAUDID) injection 1 mg  1 mg Intravenous Q2H PRN Earnstine Regal, PA-C   1 mg at 12/21/18 2338   ondansetron (ZOFRAN-ODT) disintegrating tablet 4 mg  4 mg Oral Q6H PRN Earnstine Regal, PA-C       Or   ondansetron [facility]) injection 4 mg  4 mg Intravenous Q6H PRN Earnstine Regal, PA-C       oxyCODONE (Oxy IR/ROXICODONE) immediate release tablet 5-10 mg  5-10 mg Oral Q4H PRN Earnstine Regal, PA-C       pantoprazole (PROTONIX) injection 40 mg  40 mg Intravenous QHS Earnstine Regal, PA-C    40 mg at 12/22/18 2156    Past Surgical History:  Procedure Laterality Date   TOE SURGERY Bilateral 1990   5th toes    Family History  Problem Relation Age of Onset   Hypertension Mother    Cancer Father    Stroke Paternal Grandmother    Colon cancer Neg Hx     Social History   Tobacco Use   Smoking status: Never Smoker   Smokeless tobacco: Never Used  Substance Use Topics   Alcohol use: Yes    Alcohol/week: 14.0 standard drinks    Types: 14 Glasses of wine per week   Drug use: No    Allergies  Allergen Reactions   Biaxin [Clarithromycin]     Feels "jittery"   Doxycycline Itching   Talwin [Pentazocine] Other (See Comments)    Hallucination      Review of Systems: All systems reviewed and negative except where noted in HPI.    Physical Exam:     BP 128/66    Pulse 68    Temp 98.4 F (36.9 C) (Oral)    Resp 15    Ht 5\' 5"  (1.651 m)    Wt 79.4 kg    SpO2 98%    BMI 29.12 kg/m  @WEIGHTLAST3 @ GENERAL:  Alert, oriented, cooperative, not in acute distress.  PSYCH: :Pleasant, normal mood and affect. HEENT:  conjunctiva pink, mucous membranes moist, neck supple without masses. No jaundice. CARDIAC:  S1 S2 normal. No murmers. PULM: Normal respiratory effort, lungs CTA bilaterally, no wheezing. ABDOMEN: Inspection: No visible peristalsis, no abnormal pulsations, skin normal.  Palpation/percussion: Soft, nontender, nondistended, no rigidity, no abnormal dullness to percussion, no hepatosplenomegaly and no palpable abdominal masses.  Auscultation: Normal bowel sounds, no abdominal bruits. Rectal exam: Deferred SKIN:  turgor, no lesions seen. Musculoskeletal:  Normal muscle tone, normal strength. NEURO: Alert and oriented x 3, no focal neurologic deficits.   Data Reviewed: I have personally reviewed following labs and imaging studies  CBC: Recent Labs  Lab 12/21/18 1446 12/23/18 0425  WBC 8.3 10.0  NEUTROABS  --  8.4*  HGB 13.3 11.5*  HCT 40.2  35.2*  MCV 93.3 96.2  PLT 288 315   Basic Metabolic Panel: Recent Labs  Lab 12/21/18 1446 12/23/18 0425  NA 137 139  K 4.7 4.4  CL 101 107  CO2 27 25  GLUCOSE 98 153*  BUN 13 7*  CREATININE 0.80 0.61  CALCIUM 8.7* 8.4*   GFR: Estimated Creatinine Clearance: 67.2 mL/min (by C-G formula based on SCr of 0.61 mg/dL). Liver Function Tests: Recent Labs  Lab 12/21/18 1446 12/23/18 0425  AST 29 41  ALT 57* 47*  ALKPHOS 108 77  BILITOT 1.4* 0.8  PROT 7.1 6.3*  ALBUMIN 4.1 3.3*   Recent Labs  Lab 12/21/18 1446 12/23/18 0425  LIPASE 48 23    Recent Results (from the past 240 hour(s))  Surgical pcr screen     Status: None   Collection Time: 12/22/18  1:16 AM  Result Value Ref Range Status   MRSA, PCR NEGATIVE NEGATIVE Final   Staphylococcus aureus NEGATIVE NEGATIVE Final    Comment: (NOTE) The Xpert SA Assay (FDA approved for NASAL specimens in patients 69 years of age and older), is one component of a comprehensive surveillance program. It is not intended to diagnose infection nor to guide or monitor treatment. Performed at [facility], [place] [address] [place], [place] [address]   Aerobic/Anaerobic Culture (surgical/deep wound)     Status: None (Preliminary result)   Collection Time: 12/22/18 12:43 PM  Result Value Ref Range Status   Specimen Description   Final    GALL BLADDER Performed at [place] [address] [place], [place] [address]    Special Requests   Final    NONE Performed at [facility], [place] [address] [place], [place] [address]    Gram Stain   Final    FEW WBC PRESENT,BOTH PMN AND MONONUCLEAR RARE GRAM POSITIVE COCCI    Culture   Final    CULTURE REINCUBATED FOR BETTER GROWTH Performed at [facility], [place] [address] [place], [place] [address]    Report Status PENDING  Incomplete      Radiology Studies: Dg Abd 1 View  Result Date: 12/13/2018 CLINICAL DATA:  73  y/o F; colonoscopy and endoscopy 12/12/2018. Increasing abdominal pain and vomiting. EXAM: ABDOMEN - 1 VIEW COMPARISON:  None. FINDINGS: The bowel gas pattern is normal. No radio-opaque calculi or other significant radiographic abnormality are seen. IMPRESSION: Normal bowel gas pattern. Electronically Signed   By: Kristine Garbe M.D.   On: 12/13/2018 06:11   Dg Cholangiogram Operative  Result Date: 12/22/2018 CLINICAL DATA:  Intraoperative cholangiogram. EXAM: INTRAOPERATIVE CHOLANGIOGRAM TECHNIQUE: Cholangiographic images from the C-arm fluoroscopic device were submitted for  interpretation post-operatively. Please see the procedural report for the amount of contrast and the fluoroscopy time utilized. COMPARISON:  None. FINDINGS: Contrast fills the biliary tree and duodenum. There is a single filling defect in the common bile duct. Bile duct stone is not excluded. The common bile duct is dilated. IMPRESSION: See above. Electronically Signed   By: Marybelle Killings M.D.   On: 12/22/2018 13:28   Mr Abdomen Mrcp Wo Contrast  Result Date: 12/23/2018 CLINICAL DATA:  73 year old female with history of right upper quadrant abdominal pain. Evaluate for common bile duct stones. EXAM: MRI ABDOMEN WITHOUT CONTRAST  (INCLUDING MRCP) TECHNIQUE: Multiplanar multisequence MR imaging of the abdomen was performed. Heavily T2-weighted images of the biliary and pancreatic ducts were obtained, and three-dimensional MRCP images were rendered by post processing. COMPARISON:  No priors.  Abdominal ultrasound 12/13/2018. FINDINGS: Lower chest: Unremarkable. Hepatobiliary: No suspicious cystic or solid hepatic lesions. No intra or extrahepatic biliary ductal dilatation noted on MRCP images. No intrahepatic biliary ductal dilatation noted on MRCP images. Common bile duct is mildly dilated measuring 7 x 11 mm (mean diameter of 9 mm) in the porta hepatis. In the distal common bile duct there is a 4 mm filling defect (coronal MRCP  image 90 of series 4), compatible with choledocholithiasis. Patient is status post cholecystectomy. Trace amount of T2 signal intensity in the gallbladder fossa, presumably a trace amount of postoperative fluid in this patient status post cholecystectomy 1 day ago. Pancreas: No pancreatic mass. No pancreatic ductal dilatation noted on MRCP images. No pancreatic or peripancreatic fluid collections. Trace amount of T2 signal intensity adjacent to the head of the pancreas and the surrounding duodenum, favored to be related to recent cholecystectomy. Spleen:  Unremarkable. Adrenals/Urinary Tract: Bilateral kidneys and adrenal glands are normal in appearance. No hydroureteronephrosis in the visualized portions of the abdomen. Stomach/Bowel: Stomach is normal in appearance. Trace amount of T2 signal intensity adjacent to the duodenum, presumably related to recent cholecystectomy. Remaining portions are unremarkable. Vascular/Lymphatic: No aneurysm identified in the visualized abdominal vasculature. No lymphadenopathy noted in the visualized abdomen. Other: Trace amount of T2 signal intensity adjacent to the duodenum, anterior to the right kidney, and inferior to the gallbladder fossa in the liver, presumably postoperative fluid. Surgical drain in position in the right subhepatic region. Musculoskeletal: No aggressive appearing osseous lesions are noted in the visualized portions of the skeleton. IMPRESSION: 1. 4 mm choledocholithiasis in the distal common bile duct. This is associated with mild common bile duct dilatation (mean diameter of 9 mm). 2. Postoperative changes of recent cholecystectomy with trace amount of fluid in the cholecystectomy bed and surrounding areas, and surgical drain in position in the right subhepatic region, as above. Electronically Signed   By: Vinnie Langton M.D.   On: 12/23/2018 04:29   Mr 3d Recon At Scanner  Result Date: 12/23/2018 CLINICAL DATA:  73 year old female with history of  right upper quadrant abdominal pain. Evaluate for common bile duct stones. EXAM: MRI ABDOMEN WITHOUT CONTRAST  (INCLUDING MRCP) TECHNIQUE: Multiplanar multisequence MR imaging of the abdomen was performed. Heavily T2-weighted images of the biliary and pancreatic ducts were obtained, and three-dimensional MRCP images were rendered by post processing. COMPARISON:  No priors.  Abdominal ultrasound 12/13/2018. FINDINGS: Lower chest: Unremarkable. Hepatobiliary: No suspicious cystic or solid hepatic lesions. No intra or extrahepatic biliary ductal dilatation noted on MRCP images. No intrahepatic biliary ductal dilatation noted on MRCP images. Common bile duct is mildly dilated measuring 7 x 11 mm (mean diameter  of 9 mm) in the porta hepatis. In the distal common bile duct there is a 4 mm filling defect (coronal MRCP image 90 of series 4), compatible with choledocholithiasis. Patient is status post cholecystectomy. Trace amount of T2 signal intensity in the gallbladder fossa, presumably a trace amount of postoperative fluid in this patient status post cholecystectomy 1 day ago. Pancreas: No pancreatic mass. No pancreatic ductal dilatation noted on MRCP images. No pancreatic or peripancreatic fluid collections. Trace amount of T2 signal intensity adjacent to the head of the pancreas and the surrounding duodenum, favored to be related to recent cholecystectomy. Spleen:  Unremarkable. Adrenals/Urinary Tract: Bilateral kidneys and adrenal glands are normal in appearance. No hydroureteronephrosis in the visualized portions of the abdomen. Stomach/Bowel: Stomach is normal in appearance. Trace amount of T2 signal intensity adjacent to the duodenum, presumably related to recent cholecystectomy. Remaining portions are unremarkable. Vascular/Lymphatic: No aneurysm identified in the visualized abdominal vasculature. No lymphadenopathy noted in the visualized abdomen. Other: Trace amount of T2 signal intensity adjacent to the  duodenum, anterior to the right kidney, and inferior to the gallbladder fossa in the liver, presumably postoperative fluid. Surgical drain in position in the right subhepatic region. Musculoskeletal: No aggressive appearing osseous lesions are noted in the visualized portions of the skeleton. IMPRESSION: 1. 4 mm choledocholithiasis in the distal common bile duct. This is associated with mild common bile duct dilatation (mean diameter of 9 mm). 2. Postoperative changes of recent cholecystectomy with trace amount of fluid in the cholecystectomy bed and surrounding areas, and surgical drain in position in the right subhepatic region, as above. Electronically Signed   By: Vinnie Langton M.D.   On: 12/23/2018 04:29   US Abdomen Limited Ruq  Addendum Date: 12/13/2018   ADDENDUM REPORT: 12/13/2018 06:10 ADDENDUM: Please excuse dictation error: There are multiple small layering gallbladder calculi with no gallbladder wall thickening or focal tenderness. No renal calculi were visualized. Electronically Signed   By: Monte Fantasia M.D.   On: 12/13/2018 06:10   Result Date: 12/13/2018 CLINICAL DATA:  Epigastric pain EXAM: ULTRASOUND ABDOMEN LIMITED RIGHT UPPER QUADRANT COMPARISON:  None. FINDINGS: Gallbladder: Multiple small layering wall thickening calculi or focal tenderness. No gallbladder Common bile duct: Limited visualization. Diameter: 3 mm. Where visualized, no filling defect. Liver: No focal lesion identified. Within normal limits in parenchymal echogenicity. Portal vein is patent on color Doppler imaging with normal direction of blood flow towards the liver. IMPRESSION: Multiple small renal calculi.  No evidence of acute cholecystitis. Electronically Signed: By: Monte Fantasia M.D. On: 12/13/2018 06:01      Lawerance Matsuo,MD 12/23/2018, 3:23 PM   CC No ref. provider found

## 2018-12-24 NOTE — Progress Notes (Signed)
  Progress Note: General Surgery Service   Assessment/Plan: Principal Problem:   Biliary colic Active Problems:   Cholelithiasis with chronic cholecystitis   Abnormal cholangiogram  s/p Procedure(s): LAPAROSCOPIC CHOLECYSTECTOMY WITH INTRAOPERATIVE CHOLANGIOGRAM  POD #1.  Laparoscopic cholecystectomy with cholangiogram Stable surgically N.p.o. with ERCP anticipated due to common bile duct stones demonstrated both on IOC and MRCP  Choledocholithiasis.  bilirubin downtrending -Await ERCP, unable to perform ERCP yesterday, patient now waits until tomorrow     LOS: 1 day  Chief Complaint/Subjective: Pain improving, no nausea or vomiting  Objective: Vital signs in last 24 hours: Temp:  [97.8 F (36.6 C)-99.3 F (37.4 C)] 97.8 F (36.6 C) (03/22 0623) Pulse Rate:  [63-68] 68 (03/22 0623) Resp:  [15-16] 16 (03/22 0623) BP: (125-151)/(56-76) 140/63 (03/22 0623) SpO2:  [97 %-100 %] 100 % (03/22 0623) Last BM Date: 12/21/18  Intake/Output from previous day: 03/21 0701 - 03/22 0700 In: 2378.2 [I.V.:2278.2; IV Piggyback:100] Out: 1440 [Urine:1350; Drains:90] Intake/Output this shift: Total I/O In: -  Out: 500 [Urine:500]  Lungs: nonlabored breathing  Cardiovascular: RRR  Abd: soft, ATTP, drain with serosanguinous output  Extremities: no edema  Neuro: AOX4  Lab Results: CBC  Recent Labs    12/21/18 1446 12/23/18 0425  WBC 8.3 10.0  HGB 13.3 11.5*  HCT 40.2 35.2*  PLT 288 245   BMET Recent Labs    12/21/18 1446 12/23/18 0425  NA 137 139  K 4.7 4.4  CL 101 107  CO2 27 25  GLUCOSE 98 153*  BUN 13 7*  CREATININE 0.80 0.61  CALCIUM 8.7* 8.4*   PT/INR No results for input(s): LABPROT, INR in the last 72 hours. ABG No results for input(s): PHART, HCO3 in the last 72 hours.  Invalid input(s): PCO2, PO2  Studies/Results:  Anti-infectives: Anti-infectives (From admission, onward)   Start     Dose/Rate Route Frequency Ordered Stop   12/22/18  1158  ceFAZolin (ANCEF) 2-4 GM/100ML-% IVPB  Status:  Discontinued    Note to Pharmacy:  Randa Evens  : cabinet override      12/22/18 1158 12/22/18 1407   12/22/18 0600  ceFAZolin (ANCEF) IVPB 2g/100 mL premix  Status:  Discontinued     2 g 200 mL/hr over 30 Minutes Intravenous On call to O.R. 12/22/18 0103 12/22/18 0855   12/22/18 0000  cefTRIAXone (ROCEPHIN) 2 g in sodium chloride 0.9 % 100 mL IVPB     2 g 200 mL/hr over 30 Minutes Intravenous  Once 12/21/18 2348 12/22/18 0037   12/21/18 1700  cefTRIAXone (ROCEPHIN) 2 g in sodium chloride 0.9 % 100 mL IVPB     2 g 200 mL/hr over 30 Minutes Intravenous Every 24 hours 12/21/18 1643        Medications: Scheduled Meds: . acetaminophen  1,000 mg Oral Q8H  . cycloSPORINE  1 drop Both Eyes BID  . escitalopram  20 mg Oral Daily  . pantoprazole (PROTONIX) IV  40 mg Intravenous QHS   Continuous Infusions: . cefTRIAXone (ROCEPHIN)  IV Stopped (12/23/18 2208)  . dextrose 5 % and 0.45 % NaCl with KCl 20 mEq/L 100 mL/hr at 12/24/18 0600   PRN Meds:.HYDROmorphone (DILAUDID) injection, ondansetron **OR** ondansetron (ZOFRAN) IV, oxyCODONE  Mickeal Skinner, MD [facility]

## 2018-12-24 NOTE — Anesthesia Postprocedure Evaluation (Signed)
Anesthesia Post Note  Patient: Beth Black  Procedure(s) Performed: LAPAROSCOPIC CHOLECYSTECTOMY WITH INTRAOPERATIVE CHOLANGIOGRAM (N/A Abdomen)     Patient location during evaluation: PACU Anesthesia Type: General Level of consciousness: awake and alert Pain management: pain level controlled Vital Signs Assessment: post-procedure vital signs reviewed and stable Respiratory status: spontaneous breathing, nonlabored ventilation, respiratory function stable and patient connected to nasal cannula oxygen Cardiovascular status: blood pressure returned to baseline and stable Postop Assessment: no apparent nausea or vomiting Anesthetic complications: no    Last Vitals:  Vitals:   12/24/18 0623 12/24/18 1430  BP: 140/63 140/76  Pulse: 68 68  Resp: 16 16  Temp: 36.6 C 36.9 C  SpO2: 100% 99%    Last Pain:  Vitals:   12/24/18 1430  TempSrc: Oral  PainSc:                  Lidia Collum

## 2018-12-24 NOTE — Progress Notes (Addendum)
Daily Rounding Note  12/24/2018, 10:02 AM  LOS: 1 day   SUBJECTIVE:   Chief complaint: choledocholithiasis     Feels ok, some post surgical soreness JP drain put out 90 ml yesterday, 60 in there at present.   Tolerating clears.    OBJECTIVE:         Vital signs in last 24 hours:    Temp:  [97.8 F (36.6 C)-99.3 F (37.4 C)] 97.8 F (36.6 C) (03/22 0623) Pulse Rate:  [63-68] 68 (03/22 0623) Resp:  [15-16] 16 (03/22 0623) BP: (125-151)/(56-76) 140/63 (03/22 0623) SpO2:  [97 %-100 %] 100 % (03/22 0623) Last BM Date: 12/21/18 Filed Weights   12/21/18 1403 12/22/18 1016  Weight: 80.2 kg 79.4 kg   General: Looks well.  In good spirits. Heart: RRR. Chest: Clear bilaterally.  No labored breathing.  No cough. Abdomen: Soft.  Tenderness across upper abdomen, greater on right.  JP drain contains about 60 mL of serosanguineous liquid. Extremities: No CCE. Neuro/Psych: Fluid speech, no gross deficits.  In good spirits.  Intake/Output from previous day: 03/21 0701 - 03/22 0700 In: 2378.2 [I.V.:2278.2; IV Piggyback:100] Out: 1440 [Urine:1350; Drains:90]  Intake/Output this shift: Total I/O In: -  Out: 500 [Urine:500]  Lab Results: Recent Labs    12/21/18 1446 12/23/18 0425  WBC 8.3 10.0  HGB 13.3 11.5*  HCT 40.2 35.2*  PLT 288 245   BMET Recent Labs    12/21/18 1446 12/23/18 0425  NA 137 139  K 4.7 4.4  CL 101 107  CO2 27 25  GLUCOSE 98 153*  BUN 13 7*  CREATININE 0.80 0.61  CALCIUM 8.7* 8.4*   LFT Recent Labs    12/21/18 1446 12/23/18 0425  PROT 7.1 6.3*  ALBUMIN 4.1 3.3*  AST 29 41  ALT 57* 47*  ALKPHOS 108 77  BILITOT 1.4* 0.8   PT/INR No results for input(s): LABPROT, INR in the last 72 hours. Hepatitis Panel No results for input(s): HEPBSAG, HCVAB, HEPAIGM, HEPBIGM in the last 72 hours.  Studies/Results: Dg Cholangiogram Operative  Result Date: 12/22/2018 CLINICAL DATA:   Intraoperative cholangiogram. EXAM: INTRAOPERATIVE CHOLANGIOGRAM TECHNIQUE: Cholangiographic images from the C-arm fluoroscopic device were submitted for interpretation post-operatively. Please see the procedural report for the amount of contrast and the fluoroscopy time utilized. COMPARISON:  None. FINDINGS: Contrast fills the biliary tree and duodenum. There is a single filling defect in the common bile duct. Bile duct stone is not excluded. The common bile duct is dilated. IMPRESSION: See above. Electronically Signed   By: Marybelle Killings M.D.   On: 12/22/2018 13:28   Mr Abdomen Mrcp Wo Contrast  Result Date: 12/23/2018 CLINICAL DATA:  73 year old female with history of right upper quadrant abdominal pain. Evaluate for common bile duct stones. EXAM: MRI ABDOMEN WITHOUT CONTRAST  (INCLUDING MRCP) TECHNIQUE: Multiplanar multisequence MR imaging of the abdomen was performed. Heavily T2-weighted images of the biliary and pancreatic ducts were obtained, and three-dimensional MRCP images were rendered by post processing. COMPARISON:  No priors.  Abdominal ultrasound 12/13/2018. FINDINGS: Lower chest: Unremarkable. Hepatobiliary: No suspicious cystic or solid hepatic lesions. No intra or extrahepatic biliary ductal dilatation noted on MRCP images. No intrahepatic biliary ductal dilatation noted on MRCP images. Common bile duct is mildly dilated measuring 7 x 11 mm (mean diameter of 9 mm) in the porta hepatis. In the distal common bile duct there is a 4 mm filling defect (coronal MRCP image 90 of series  4), compatible with choledocholithiasis. Patient is status post cholecystectomy. Trace amount of T2 signal intensity in the gallbladder fossa, presumably a trace amount of postoperative fluid in this patient status post cholecystectomy 1 day ago. Pancreas: No pancreatic mass. No pancreatic ductal dilatation noted on MRCP images. No pancreatic or peripancreatic fluid collections. Trace amount of T2 signal intensity  adjacent to the head of the pancreas and the surrounding duodenum, favored to be related to recent cholecystectomy. Spleen:  Unremarkable. Adrenals/Urinary Tract: Bilateral kidneys and adrenal glands are normal in appearance. No hydroureteronephrosis in the visualized portions of the abdomen. Stomach/Bowel: Stomach is normal in appearance. Trace amount of T2 signal intensity adjacent to the duodenum, presumably related to recent cholecystectomy. Remaining portions are unremarkable. Vascular/Lymphatic: No aneurysm identified in the visualized abdominal vasculature. No lymphadenopathy noted in the visualized abdomen. Other: Trace amount of T2 signal intensity adjacent to the duodenum, anterior to the right kidney, and inferior to the gallbladder fossa in the liver, presumably postoperative fluid. Surgical drain in position in the right subhepatic region. Musculoskeletal: No aggressive appearing osseous lesions are noted in the visualized portions of the skeleton. IMPRESSION: 1. 4 mm choledocholithiasis in the distal common bile duct. This is associated with mild common bile duct dilatation (mean diameter of 9 mm). 2. Postoperative changes of recent cholecystectomy with trace amount of fluid in the cholecystectomy bed and surrounding areas, and surgical drain in position in the right subhepatic region, as above. Electronically Signed   By: Vinnie Langton M.D.   On: 12/23/2018 04:29   Mr 3d Recon At Scanner  Result Date: 12/23/2018 CLINICAL DATA:  73 year old female with history of right upper quadrant abdominal pain. Evaluate for common bile duct stones. EXAM: MRI ABDOMEN WITHOUT CONTRAST  (INCLUDING MRCP) TECHNIQUE: Multiplanar multisequence MR imaging of the abdomen was performed. Heavily T2-weighted images of the biliary and pancreatic ducts were obtained, and three-dimensional MRCP images were rendered by post processing. COMPARISON:  No priors.  Abdominal ultrasound 12/13/2018. FINDINGS: Lower chest:  Unremarkable. Hepatobiliary: No suspicious cystic or solid hepatic lesions. No intra or extrahepatic biliary ductal dilatation noted on MRCP images. No intrahepatic biliary ductal dilatation noted on MRCP images. Common bile duct is mildly dilated measuring 7 x 11 mm (mean diameter of 9 mm) in the porta hepatis. In the distal common bile duct there is a 4 mm filling defect (coronal MRCP image 90 of series 4), compatible with choledocholithiasis. Patient is status post cholecystectomy. Trace amount of T2 signal intensity in the gallbladder fossa, presumably a trace amount of postoperative fluid in this patient status post cholecystectomy 1 day ago. Pancreas: No pancreatic mass. No pancreatic ductal dilatation noted on MRCP images. No pancreatic or peripancreatic fluid collections. Trace amount of T2 signal intensity adjacent to the head of the pancreas and the surrounding duodenum, favored to be related to recent cholecystectomy. Spleen:  Unremarkable. Adrenals/Urinary Tract: Bilateral kidneys and adrenal glands are normal in appearance. No hydroureteronephrosis in the visualized portions of the abdomen. Stomach/Bowel: Stomach is normal in appearance. Trace amount of T2 signal intensity adjacent to the duodenum, presumably related to recent cholecystectomy. Remaining portions are unremarkable. Vascular/Lymphatic: No aneurysm identified in the visualized abdominal vasculature. No lymphadenopathy noted in the visualized abdomen. Other: Trace amount of T2 signal intensity adjacent to the duodenum, anterior to the right kidney, and inferior to the gallbladder fossa in the liver, presumably postoperative fluid. Surgical drain in position in the right subhepatic region. Musculoskeletal: No aggressive appearing osseous lesions are noted in the visualized  portions of the skeleton. IMPRESSION: 1. 4 mm choledocholithiasis in the distal common bile duct. This is associated with mild common bile duct dilatation (mean diameter of  9 mm). 2. Postoperative changes of recent cholecystectomy with trace amount of fluid in the cholecystectomy bed and surrounding areas, and surgical drain in position in the right subhepatic region, as above. Electronically Signed   By: Vinnie Langton M.D.   On: 12/23/2018 04:29   Scheduled Meds:  acetaminophen  1,000 mg Oral Q8H   cycloSPORINE  1 drop Both Eyes BID   escitalopram  20 mg Oral Daily   pantoprazole (PROTONIX) IV  40 mg Intravenous QHS   Continuous Infusions:  cefTRIAXone (ROCEPHIN)  IV Stopped (12/23/18 2208)   dextrose 5 % and 0.45 % NaCl with KCl 20 mEq/L 100 mL/hr at 12/24/18 0600   PRN Meds:.HYDROmorphone (DILAUDID) injection, ondansetron **OR** ondansetron (ZOFRAN) IV, oxyCODONE   ASSESMENT:   *  Choledocholithiasis  Filling defect on IOC. CBD stone on 3/20 MRCP.   Minor elevation ALT.  On daily Rocephin.   *   S/p lap chole with IOC 3/20.     PLAN   *   ERCP 8 AM tomorrow.   Okay to have full liquids today, even solids if she wants.  N.p.o. after midnight    Beth Black  12/24/2018, 10:02 AM Phone [phone]    Attending physician's note   I agree with the Advanced Practitioner's note, impression and recommendations.   Choledocholithiasis for ERCP in a.m.  I have discussed risks and benefits including risks of pancreatitis, bleeding, perforation. LFTs better.  No pancreatitis or ascending cholangitis.  Carmell Austria, MD

## 2018-12-25 ENCOUNTER — Encounter (HOSPITAL_COMMUNITY): Payer: Self-pay | Admitting: Anesthesiology

## 2018-12-25 ENCOUNTER — Inpatient Hospital Stay (HOSPITAL_COMMUNITY): Payer: Medicare Other

## 2018-12-25 ENCOUNTER — Encounter (HOSPITAL_COMMUNITY): Admission: EM | Disposition: A | Discharge status: Home / Self Care | Payer: Self-pay | Source: Home / Self Care

## 2018-12-25 ENCOUNTER — Inpatient Hospital Stay (HOSPITAL_COMMUNITY): Payer: Medicare Other | Admitting: Anesthesiology

## 2018-12-25 HISTORY — PX: ERCP: SHX5425

## 2018-12-25 HISTORY — PX: REMOVAL OF STONES: SHX5545

## 2018-12-25 HISTORY — PX: SPHINCTEROTOMY: SHX5544

## 2018-12-25 HISTORY — PX: BILIARY DILATION: SHX6850

## 2018-12-25 LAB — COMPREHENSIVE METABOLIC PANEL
ALBUMIN: 2.9 g/dL — AB (ref 3.5–5.0)
ALT: 36 U/L (ref 0–44)
AST: 23 U/L (ref 15–41)
Alkaline Phosphatase: 85 U/L (ref 38–126)
Anion gap: 6 (ref 5–15)
BUN: 6 mg/dL — AB (ref 8–23)
CO2: 27 mmol/L (ref 22–32)
Calcium: 8.4 mg/dL — ABNORMAL LOW (ref 8.9–10.3)
Chloride: 107 mmol/L (ref 98–111)
Creatinine, Ser: 0.65 mg/dL (ref 0.44–1.00)
GFR calc Af Amer: >60 mL/min (ref >60)
Glucose, Bld: 110 mg/dL — ABNORMAL HIGH (ref 70–99)
Potassium: 3.9 mmol/L (ref 3.5–5.1)
Sodium: 140 mmol/L (ref 135–145)
Total Bilirubin: 0.5 mg/dL (ref 0.3–1.2)
Total Protein: 5.8 g/dL — ABNORMAL LOW (ref 6.5–8.1)

## 2018-12-25 LAB — CBC
HCT: 35.4 % — ABNORMAL LOW (ref 36.0–46.0)
Hemoglobin: 11.3 g/dL — ABNORMAL LOW (ref 12.0–15.0)
MCH: 30.6 pg (ref 26.0–34.0)
MCHC: 31.9 g/dL (ref 30.0–36.0)
MCV: 95.9 fL (ref 80.0–100.0)
Platelets: 240 10*3/uL (ref 150–400)
RBC: 3.69 MIL/uL — ABNORMAL LOW (ref 3.87–5.11)
RDW: 11.9 % (ref 11.5–15.5)
WBC: 5.5 10*3/uL (ref 4.0–10.5)
nRBC: 0 % (ref 0.0–0.2)

## 2018-12-25 SURGERY — ERCP, WITH INTERVENTION IF INDICATED
Anesthesia: General

## 2018-12-25 MED ORDER — PROPOFOL 10 MG/ML IV BOLUS
INTRAVENOUS | Status: AC
  Filled 2018-12-25: qty 60

## 2018-12-25 MED ORDER — OXYCODONE HCL 5 MG PO TABS: 5.0000 mg | ORAL_TABLET | Freq: Four times a day (QID) | ORAL | 0 refills | Status: DC | PRN

## 2018-12-25 MED ORDER — SODIUM CHLORIDE 0.9 % IV SOLN
INTRAVENOUS | Status: DC | PRN
  Administered 2018-12-25: 30 mL

## 2018-12-25 MED ORDER — FENTANYL CITRATE (PF) 100 MCG/2ML IJ SOLN
INTRAMUSCULAR | Status: DC | PRN
  Administered 2018-12-25: 25 ug via INTRAVENOUS
  Administered 2018-12-25: 50 ug via INTRAVENOUS
  Administered 2018-12-25: 25 ug via INTRAVENOUS

## 2018-12-25 MED ORDER — LIDOCAINE 2% (20 MG/ML) 5 ML SYRINGE
INTRAMUSCULAR | Status: DC | PRN
  Administered 2018-12-25: 100 mg via INTRAVENOUS

## 2018-12-25 MED ORDER — GLUCAGON HCL RDNA (DIAGNOSTIC) 1 MG IJ SOLR
INTRAMUSCULAR | Status: DC | PRN
  Administered 2018-12-25: .2 mg via INTRAVENOUS

## 2018-12-25 MED ORDER — PROPOFOL 10 MG/ML IV BOLUS
INTRAVENOUS | Status: DC | PRN
  Administered 2018-12-25: 150 mg via INTRAVENOUS

## 2018-12-25 MED ORDER — SUCCINYLCHOLINE CHLORIDE 200 MG/10ML IV SOSY
PREFILLED_SYRINGE | INTRAVENOUS | Status: DC | PRN
  Administered 2018-12-25: 120 mg via INTRAVENOUS

## 2018-12-25 MED ORDER — CHLORHEXIDINE GLUCONATE CLOTH 2 % EX PADS: 6.0000 | MEDICATED_PAD | Freq: Once | CUTANEOUS | Status: DC

## 2018-12-25 MED ORDER — GLUCAGON HCL RDNA (DIAGNOSTIC) 1 MG IJ SOLR
INTRAMUSCULAR | Status: AC
  Filled 2018-12-25: qty 1

## 2018-12-25 MED ORDER — LACTATED RINGERS IV SOLN
INTRAVENOUS | Status: DC | PRN
  Administered 2018-12-25: 07:00:00 via INTRAVENOUS

## 2018-12-25 MED ORDER — SODIUM CHLORIDE 0.9 % IV SOLN: INTRAVENOUS | Status: DC

## 2018-12-25 MED ORDER — EPHEDRINE SULFATE-NACL 50-0.9 MG/10ML-% IV SOSY
PREFILLED_SYRINGE | INTRAVENOUS | Status: DC | PRN
  Administered 2018-12-25: 10 mg via INTRAVENOUS

## 2018-12-25 MED ORDER — IBUPROFEN 200 MG PO TABS: ORAL_TABLET | ORAL | Status: DC

## 2018-12-25 MED ORDER — FENTANYL CITRATE (PF) 100 MCG/2ML IJ SOLN
INTRAMUSCULAR | Status: AC
  Filled 2018-12-25: qty 2

## 2018-12-25 MED ORDER — DEXAMETHASONE SODIUM PHOSPHATE 10 MG/ML IJ SOLN
INTRAMUSCULAR | Status: DC | PRN
  Administered 2018-12-25: 8 mg via INTRAVENOUS

## 2018-12-25 MED ORDER — INDOMETHACIN 50 MG RE SUPP
RECTAL | Status: AC
  Filled 2018-12-25: qty 2

## 2018-12-25 MED ORDER — SODIUM CHLORIDE 0.9 % IV SOLN: INTRAVENOUS | Status: DC | PRN

## 2018-12-25 MED ORDER — INDOMETHACIN 50 MG RE SUPP
RECTAL | Status: DC | PRN
  Administered 2018-12-25: 100 mg via RECTAL

## 2018-12-25 MED ORDER — ACETAMINOPHEN 500 MG PO TABS: ORAL_TABLET | ORAL | 0 refills | Status: DC

## 2018-12-25 MED ORDER — ONDANSETRON HCL 4 MG/2ML IJ SOLN
INTRAMUSCULAR | Status: DC | PRN
  Administered 2018-12-25: 4 mg via INTRAVENOUS

## 2018-12-25 NOTE — Op Note (Signed)
[facility] Patient Name: Beth Black Procedure Date: 12/25/2018 MRN: [id number] Attending MD: Jackquline Denmark , MD Date of Birth: [birth year] CSN: [id number] Age: 73 Admit Type: Inpatient Procedure:                ERCP Indications:              Bile duct stone(s) Providers:                Jackquline Denmark, MD, Cleda Daub, RN, Tinnie Gens,                            Technician, Arnoldo Hooker, CRNA Referring MD:              Medicines:                Monitored Anesthesia Care Complications:            No immediate complications. Estimated Blood Loss:     Estimated blood loss: none. Procedure:                Pre-Anesthesia Assessment:                           - Prior to the procedure, a History and Physical                            was performed, and patient medications and                            allergies were reviewed. The patient's tolerance of                            previous anesthesia was also reviewed. The risks                            and benefits of the procedure and the sedation                            options and risks were discussed with the patient.                            All questions were answered, and informed consent                            was obtained. Prior Anticoagulants: The patient has                            taken no previous anticoagulant or antiplatelet                            agents. ASA Grade Assessment: II - A patient with                            mild systemic disease. After reviewing the risks  and benefits, the patient was deemed in                            satisfactory condition to undergo the procedure.                           After obtaining informed consent, the scope was                            passed under direct vision. Throughout the                            procedure, the patient's blood pressure, pulse, and                            oxygen saturations were  monitored continuously. The                            TJF-Q180V ([id number]) Olympus duodenoscope was                            introduced through the mouth, and used to inject                            contrast into and used to inject contrast into the                            bile duct. The ERCP was accomplished without                            difficulty. The patient tolerated the procedure                            well. Scope In: Scope Out: Findings:      The scout film was normal. The esophagus was successfully intubated       under direct vision. The scope was advanced to a normal major papilla in       the descending duodenum without detailed examination of the pharynx,       larynx and associated structures, and upper GI tract. The upper GI tract       was grossly normal. The bile duct was deeply cannulated with the       short-nosed traction sphincterotome. Contrast was injected. I personally       interpreted the bile duct images. The lower third of the main bile duct       contained one stone, which was 6 mm in diameter. The main bile duct was       moderately dilated, with a stone causing an obstruction. The largest       diameter was 12 mm. There was evidence of previous cholecystectomy.       There were no leaks. A 6 mm biliary sphincterotomy was made with a       traction (standard) sphincterotome using ERBE electrocautery. There was       slight oozing. Dilation of the common bile duct with a 12 mm balloon  dilator was successful. One stone was removed. No stones remained on       postocclusion cholangiogram. Bleeding has stopped completely towards the       end of the procedure.      Pancreatic duct was intentionally not cannulated decrease the risk of       pancreatitis. Impression:               - Choledocholithiasis s/p biliary sphincterotomy                            and balloon extraction followed by post occlusion                             cholangiogram. Moderate Sedation:      Not Applicable - Patient had care per Anesthesia. Recommendation:           - Avoid aspirin and nonsteroidal anti-inflammatory                            medicines for 5 days.                           - Watch for pancreatitis, bleeding, perforation,                            and cholangitis.                           - Clear liquid diet. Advance to low-fat diet as                            tolerated.                           - I have discussed above with the patient's husband                            over the phone as well. Procedure Code(s):        --- Professional ---                           [phone], 63, Endoscopic retrograde                            cholangiopancreatography (ERCP); with                            trans-endoscopic balloon dilation of                            biliary/pancreatic duct(s) or of ampulla                            (sphincteroplasty), including sphincterotomy, when                            performed, each duct  [phone], Endoscopic retrograde                            cholangiopancreatography (ERCP); with removal of                            calculi/debris from biliary/pancreatic duct(s)                           [phone], Endoscopic catheterization of the biliary                            ductal system, radiological supervision and                            interpretation Diagnosis Code(s):        --- Professional ---                           K80.51, Calculus of bile duct without cholangitis                            or cholecystitis with obstruction CPT copyright 2018 [employer]. All rights reserved. The codes documented in this report are preliminary and upon coder review may  be revised to meet current compliance requirements. Jackquline Denmark, MD 12/25/2018 9:28:25 AM This report has been signed electronically. Number of Addenda: 0

## 2018-12-25 NOTE — Anesthesia Postprocedure Evaluation (Signed)
Anesthesia Post Note  Patient: Beth Black  Procedure(s) Performed: ENDOSCOPIC RETROGRADE CHOLANGIOPANCREATOGRAPHY (ERCP) (N/A ) SPHINCTEROTOMY REMOVAL OF STONES BILIARY DILATION     Patient location during evaluation: PACU Anesthesia Type: General Level of consciousness: awake Pain management: pain level controlled Vital Signs Assessment: post-procedure vital signs reviewed and stable Respiratory status: spontaneous breathing Cardiovascular status: stable Postop Assessment: no apparent nausea or vomiting Anesthetic complications: no    Last Vitals:  Vitals:   12/25/18 0935 12/25/18 0950  BP:  (!) 161/81  Pulse: 70 73  Resp: 11 13  Temp:    SpO2: 100% 93%    Last Pain:  Vitals:   12/25/18 1000  TempSrc:   PainSc: 0-No pain                 Estaban Mainville

## 2018-12-25 NOTE — Progress Notes (Signed)
Day of Surgery    CC:  Abdominal pain  Subjective: She feels fine, the only thing that hurts is the drain.  She has not had anything p.o. after her ERCP, so far.  She has clear liquids ordered.  Objective: Vital signs in last 24 hours: Temp:  [97.7 F (36.5 C)-98.4 F (36.9 C)] 98.4 F (36.9 C) (03/23 0728) Pulse Rate:  [60-69] 65 (03/23 0728) Resp:  [15-16] 15 (03/23 0728) BP: (140-165)/(75-81) 165/81 (03/23 0728) SpO2:  [97 %-99 %] 97 % (03/23 0728) Weight:  [79.4 kg] 79.4 kg (03/23 0728) Last BM Date: 12/21/18 2387 IV recorded Nothing p.o. recorded 1800 urine 70 from the drain. Afebrile vital signs are stable LFTs are stable. WBC 5.5  Intake/Output from previous day: 03/22 0701 - 03/23 0700 In: 2387.3 [I.V.:2387.3] Out: 1870 [Urine:1800; Drains:70] Intake/Output this shift: No intake/output data recorded.  General appearance: alert, cooperative and no distress Resp: clear to auscultation bilaterally GI: Soft, sites look fine.  Abdomen is not distended or tender.  JP drain shows just serous fluid.  Lab Results:  Recent Labs    12/23/18 0425 12/25/18 0418  WBC 10.0 5.5  HGB 11.5* 11.3*  HCT 35.2* 35.4*  PLT 245 240    BMET Recent Labs    12/23/18 0425 12/25/18 0418  NA 139 140  K 4.4 3.9  CL 107 107  CO2 25 27  GLUCOSE 153* 110*  BUN 7* 6*  CREATININE 0.61 0.65  CALCIUM 8.4* 8.4*   PT/INR No results for input(s): LABPROT, INR in the last 72 hours.  Recent Labs  Lab 12/21/18 1446 12/23/18 0425 12/25/18 0418  AST 29 41 23  ALT 57* 47* 36  ALKPHOS 108 77 85  BILITOT 1.4* 0.8 0.5  PROT 7.1 6.3* 5.8*  ALBUMIN 4.1 3.3* 2.9*     Lipase     Component Value Date/Time   LIPASE 23 12/23/2018 0425   Prior to Admission medications   Medication Sig Start Date End Date Taking? Authorizing Provider  acetaminophen (TYLENOL) 500 MG tablet Take 500-1,000 mg by mouth every 6 (six) hours as needed for moderate pain.   Yes [provider]   cycloSPORINE (RESTASIS) 0.05 % ophthalmic emulsion Place 1 drop into both eyes 2 (two) times daily.    Yes [provider]  dicyclomine (BENTYL) 20 MG tablet Take 1 tablet (20 mg total) by mouth every 8 (eight) hours as needed for spasms (Abdominal cramping). 12/13/18  Yes Ward, Kristen N, DO  ELDERBERRY PO Take 2 tablets by mouth daily.   Yes [provider]  escitalopram (LEXAPRO) 20 MG tablet Take 20 mg by mouth daily.   Yes [provider]  ezetimibe (ZETIA) 10 MG tablet Take 10 mg by mouth daily.   Yes [provider]  Fish Oil-Vitamin D 1000-1000 MG-UNIT CAPS Take 3 capsules by mouth daily.   Yes [provider]  omeprazole (PRILOSEC) 40 MG capsule Take 1 capsule (40 mg total) by mouth daily. 12/12/18  Yes Thornton Park, MD  ondansetron (ZOFRAN ODT) 4 MG disintegrating tablet Take 1 tablet (4 mg total) by mouth every 6 (six) hours as needed for nausea or vomiting. 12/13/18   Ward, Delice Bison, DO  oxyCODONE-acetaminophen (PERCOCET/ROXICET) 5-325 MG tablet Take 2 tablets by mouth every 6 (six) hours as needed. 12/13/18   Ward, Delice Bison, DO     Medications: . [MAR Hold] acetaminophen  1,000 mg Oral Q8H  . Chlorhexidine Gluconate Cloth  6 each Topical Once  And  . Chlorhexidine Gluconate Cloth  6 each Topical Once  . [MAR Hold] cycloSPORINE  1 drop Both Eyes BID  . [MAR Hold] escitalopram  20 mg Oral Daily  . [MAR Hold] pantoprazole (PROTONIX) IV  40 mg Intravenous QHS   Anti-infectives (From admission, onward)   Start     Dose/Rate Route Frequency Ordered Stop   12/22/18 1158  ceFAZolin (ANCEF) 2-4 GM/100ML-% IVPB  Status:  Discontinued    Note to Pharmacy:  Randa Evens  : cabinet override      12/22/18 1158 12/22/18 1407   12/22/18 0600  ceFAZolin (ANCEF) IVPB 2g/100 mL premix  Status:  Discontinued     2 g 200 mL/hr over 30 Minutes Intravenous On call to O.R. 12/22/18 0103 12/22/18 0855   12/22/18 0000  cefTRIAXone (ROCEPHIN) 2 g  in sodium chloride 0.9 % 100 mL IVPB     2 g 200 mL/hr over 30 Minutes Intravenous  Once 12/21/18 2348 12/22/18 0037   12/21/18 1700  [MAR Hold]  cefTRIAXone (ROCEPHIN) 2 g in sodium chloride 0.9 % 100 mL IVPB     (MAR Hold since Mon 12/25/2018 at 0724. Reason: Transfer to a Procedural area.)   2 g 200 mL/hr over 30 Minutes Intravenous Every 24 hours 12/21/18 1643      Assessment/Plan  Acute cholecystitis, cholelithiasis, choledocholithiasis. Laparoscopic cholecystectomy with intraoperative cholangiogram 12/22/2018, Dr. Armandina Gemma  - IOC :  There is a single filling defect in the common bile duct. Bile duct stone is not     excluded. The common bile duct is dilated.  - MRCP 3/21:  4 mm choledocholithiasis in the distal common bile duct. This is     associated with mild common bile duct dilatation (mean diameter of 9 mm).  - ERCP, sphincterotomy and stone removal, 12/25/2018 Dr. Jackquline Denmark    FEN: IV fluids/clear liquids ID: Rocephin 3/18 - 3/22 DVT: SCDs Follow-up: DOW  [facility] 3/27 to pull drain  Plan:  From our standpoint she can go home later today if OK with Dr. Lyndel Safe.  She is just on clears.      LOS: 2 days    Nadelyn Enriques 12/25/2018 [phone]

## 2018-12-25 NOTE — Anesthesia Preprocedure Evaluation (Addendum)
Anesthesia Evaluation  Patient identified by MRN, date of birth, ID band Patient awake    Reviewed: Allergy & Precautions, NPO status , Patient's Chart, lab work & pertinent test results  Airway Mallampati: II       Dental   Pulmonary neg pulmonary ROS,    breath sounds clear to auscultation       Cardiovascular  Rhythm:Regular Rate:Normal     Neuro/Psych    GI/Hepatic Neg liver ROS, History noted. CG   Endo/Other    Renal/GU negative Renal ROS     Musculoskeletal   Abdominal   Peds  Hematology   Anesthesia Other Findings   Reproductive/Obstetrics                            Anesthesia Physical Anesthesia Plan  ASA: III  Anesthesia Plan: General   Post-op Pain Management:    Induction: Intravenous  PONV Risk Score and Plan: 3 and Treatment may vary due to age or medical condition  Airway Management Planned: Oral ETT  Additional Equipment:   Intra-op Plan:   Post-operative Plan: Extubation in OR  Informed Consent: I have reviewed the patients History and Physical, chart, labs and discussed the procedure including the risks, benefits and alternatives for the proposed anesthesia with the patient or authorized representative who has indicated his/her understanding and acceptance.     Dental advisory given  Plan Discussed with: Anesthesiologist and CRNA  Anesthesia Plan Comments:        Anesthesia Quick Evaluation

## 2018-12-25 NOTE — Transfer of Care (Signed)
Immediate Anesthesia Transfer of Care Note  Patient: Beth Black  Procedure(s) Performed: Procedure(s): ENDOSCOPIC RETROGRADE CHOLANGIOPANCREATOGRAPHY (ERCP) (N/A)  Patient Location: PACU  Anesthesia Type:General  Level of Consciousness:  sedated, patient cooperative and responds to stimulation  Airway & Oxygen Therapy:Patient Spontanous Breathing and Patient connected to face mask oxgen  Post-op Assessment:  Report given to PACU RN and Post -op Vital signs reviewed and stable  Post vital signs:  Reviewed and stable  Last Vitals:  Vitals:   12/25/18 0728 12/25/18 0924  BP: (!) 165/81 (!) 172/80  Pulse: 65 75  Resp: 15 10  Temp: 36.9 C 37 C  SpO2: 12% 904%    Complications: No apparent anesthesia complications

## 2018-12-25 NOTE — Discharge Instructions (Signed)
[facility] ______[facility] °LAPAROSCOPIC SURGERY: POST OP INSTRUCTIONS °Always review your discharge instruction sheet given to you by the facility where your surgery was performed. °IF YOU HAVE DISABILITY OR FAMILY LEAVE FORMS, YOU MUST BRING THEM TO THE OFFICE FOR PROCESSING.   °DO NOT GIVE THEM TO YOUR DOCTOR. ° °1. A prescription for pain medication may be given to you upon discharge.  Take your pain medication as prescribed, if needed.  If narcotic pain medicine is not needed, then you may take acetaminophen (Tylenol) or ibuprofen (Advil) as needed. °2. Take your usually prescribed medications unless otherwise directed. °3. If you need a refill on your pain medication, please contact your pharmacy.  They will contact our office to request authorization. Prescriptions will not be filled after 5pm or on week-ends. °4. You should follow a light diet the first few days after arrival home, such as soup and crackers, etc.  Be sure to include lots of fluids daily. °5. Most patients will experience some swelling and bruising in the area of the incisions.  Ice packs will help.  Swelling and bruising can take several days to resolve.  °6. It is common to experience some constipation if taking pain medication after surgery.  Increasing fluid intake and taking a stool softener (such as Colace) will usually help or prevent this problem from occurring.  A mild laxative (Milk of Magnesia or Miralax) should be taken according to package instructions if there are no bowel movements after 48 hours. °7. Unless discharge instructions indicate otherwise, you may remove your bandages 24-48 hours after surgery, and you may shower at that time.  You may have steri-strips (small skin tapes) in place directly over the incision.  These strips should be left on the skin for 7-10 days.  If your surgeon used skin glue on the incision, you may shower in 24 hours.  The glue will flake off over the next 2-3 weeks.  Any sutures or  staples will be removed at the office during your follow-up visit. °8. ACTIVITIES:  You may resume regular (light) daily activities beginning the next day--such as daily self-care, walking, climbing stairs--gradually increasing activities as tolerated.  You may have sexual intercourse when it is comfortable.  Refrain from any heavy lifting or straining until approved by your doctor. °a. You may drive when you are no longer taking prescription pain medication, you can comfortably wear a seatbelt, and you can safely maneuver your car and apply brakes. °b. RETURN TO WORK:  __________________________________________________________ °9. You should see your doctor in the office for a follow-up appointment approximately 2-3 weeks after your surgery.  Make sure that you call for this appointment within a day or two after you arrive home to insure a convenient appointment time. °10. OTHER INSTRUCTIONS: __________________________________________________________________________________________________________________________ __________________________________________________________________________________________________________________________ °WHEN TO CALL YOUR DOCTOR: °1. Fever over 101.0 °2. Inability to urinate °3. Continued bleeding from incision. °4. Increased pain, redness, or drainage from the incision. °5. Increasing abdominal pain ° °The clinic staff is available to answer your questions during regular business hours.  Please don’t hesitate to call and ask to speak to one of the nurses for clinical concerns.  If you have a medical emergency, go to the nearest emergency room or call 911.  A surgeon from [facility] is always on call at the hospital. °[address], [address], [place], [place]  [address] ? [address] [address], [place], [place]   [address] °[phone] ? [phone] ? FAX [phone] °Web site:   [email or web address] °

## 2018-12-25 NOTE — Interval H&P Note (Signed)
History and Physical Interval Note:  12/25/2018 8:02 AM  [name]  has presented today for surgery, with the diagnosis of S/P cholecystectomy IOC + filling defect CBD.  The various methods of treatment have been discussed with the patient and family. After consideration of risks, benefits and other options for treatment, the patient has consented to  Procedure(s): ENDOSCOPIC RETROGRADE CHOLANGIOPANCREATOGRAPHY (ERCP) (N/A) as a surgical intervention.  The patient's history has been reviewed, patient examined, no change in status, stable for surgery.  I have reviewed the patient's chart and labs.  Questions were answered to the patient's satisfaction.     Beth Black

## 2018-12-25 NOTE — Anesthesia Procedure Notes (Signed)
Procedure Name: Intubation Date/Time: 12/25/2018 8:35 AM Performed by: Lavina Hamman, CRNA Pre-anesthesia Checklist: Patient identified, Emergency Drugs available, Suction available, Patient being monitored and Timeout performed Patient Re-evaluated:Patient Re-evaluated prior to induction Oxygen Delivery Method: Circle system utilized Preoxygenation: Pre-oxygenation with 100% oxygen Induction Type: IV induction, Rapid sequence and Cricoid Pressure applied Ventilation: Mask ventilation without difficulty Laryngoscope Size: Mac and 3 Grade View: Grade I Tube type: Oral Tube size: 7.0 mm Number of attempts: 1 Airway Equipment and Method: Stylet Placement Confirmation: ETT inserted through vocal cords under direct vision,  positive ETCO2,  CO2 detector and breath sounds checked- equal and bilateral Secured at: 21 cm Tube secured with: Tape Dental Injury: Teeth and Oropharynx as per pre-operative assessment  Comments: ATOI.  RSI due to risk of Covid 19 transmission.  Pt asymptomatic.

## 2018-12-25 NOTE — Discharge Summary (Signed)
[facility] Discharge Summary   Patient ID: RONEKA GILPIN MRN: [id number] DOB/AGE: 11/15/1945 73 y.o.  Admit date: 12/21/2018 Discharge date: 12/25/2018  Admitting Diagnosis: Symptomatic cholelithiasis Chronic cholecystitis  Discharge Diagnosis Choledocholithiasis Chronic cholecystitis  Consultants Gastroenterology   Imaging: Dg Ercp Biliary & Pancreatic Ducts  Result Date: 12/25/2018 CLINICAL DATA:  Bile duct stone EXAM: ERCP TECHNIQUE: Multiple spot images obtained with the fluoroscopic device and submitted for interpretation post-procedure. FLUOROSCOPY TIME:  2 minutes, 14 seconds (19.95 mGy). COMPARISON:  MRCP-12/22/2018; intraoperative cholangiogram during laparoscopic cholecystectomy-12/22/2018 FINDINGS: 10 spot intraoperative fluoroscopic images of the right upper abdominal quadrant during ERCP are provided review. Initial image demonstrates an ERCP probe overlying the right upper abdominal quadrant. Cholecystectomy clips and a surgical drain overlies expected location of the gallbladder fossa. Subsequent images demonstrate insufflation opacification the common bile duct which appears moderately dilated. There is minimal opacification of the left-sided intrahepatic biliary tree which appears mildly dilated. There is no definitive opacification of the cystic duct remnant. Subsequent images demonstrate insufflation of a balloon within the distal aspect of the CBD. Subsequent images demonstrate presumed biliary sweeping and sphincterotomy. IMPRESSION: ERCP with findings of biliary plasty and presumed biliary sweeping and sphincterotomy as detailed above. These images were submitted for radiologic interpretation only. Please see the procedural report for the amount of contrast and the fluoroscopy time utilized. Electronically Signed   By: Sandi Mariscal M.D.   On: 12/25/2018 09:33    Procedures Dr. Harlow Asa (12/22/18) - Laparoscopic Cholecystectomy with IOC Dr. Lyndel Safe  (12/25/18) - ERCP   Hospital Course:  Patient is a 73 year old female who presented to [facility] with abdominal pain.  Workup showed chronic cholecytitis.  Patient was admitted and underwent procedure listed above. IOC positive for CBD filling defect and GI consulted. Patient underwent ERCP 3/23. Tolerated procedure well and was transferred to the floor.  Diet was advanced as tolerated.  On POD#3, the patient was voiding well, tolerating diet, ambulating well, pain well controlled, vital signs stable, incisions c/d/i and felt stable for discharge home. Drain removed prior to discharge.  Patient will follow up in our office in 2 weeks and knows to call with questions or concerns. She will call to confirm appointment date/time.     Allergies as of 12/25/2018      Reactions   Biaxin [clarithromycin]    Feels "jittery"   Doxycycline Itching   Talwin [pentazocine] Other (See Comments)   Hallucination      Medication List    STOP taking these medications   oxyCODONE-acetaminophen 5-325 MG tablet Commonly known as:  PERCOCET/ROXICET     TAKE these medications   acetaminophen 500 MG tablet Commonly known as:  TYLENOL You can take 2 tablets every 8 hours as needed for pain.  This will be your primary pain medication.  Use this as your first pain control medicine. You can buy this over-the-counter at any drugstore.  Do not take more than 4000 mg of Tylenol per day it can harm your liver. What changed:    how much to take  how to take this  when to take this  reasons to take this  additional instructions   cycloSPORINE 0.05 % ophthalmic emulsion Commonly known as:  RESTASIS Place 1 drop into both eyes 2 (two) times daily.   dicyclomine 20 MG tablet Commonly known as:  BENTYL Take 1 tablet (20 mg total) by mouth every 8 (eight) hours as needed for spasms (Abdominal cramping).   ELDERBERRY PO Take  2 tablets by mouth daily.   escitalopram 20 MG tablet Commonly known as:  LEXAPRO Take 20  mg by mouth daily.   ezetimibe 10 MG tablet Commonly known as:  ZETIA Take 10 mg by mouth daily.   Fish Oil-Vitamin D 1000-1000 MG-UNIT Caps Take 3 capsules by mouth daily.   omeprazole 40 MG capsule Commonly known as:  PRILOSEC Take 1 capsule (40 mg total) by mouth daily.   ondansetron 4 MG disintegrating tablet Commonly known as:  Zofran ODT Take 1 tablet (4 mg total) by mouth every 6 (six) hours as needed for nausea or vomiting.   oxyCODONE 5 MG immediate release tablet Commonly known as:  Oxy IR/ROXICODONE Take 1 tablet (5 mg total) by mouth every 6 (six) hours as needed for moderate pain or severe pain.        Follow-up Information    [facility] up on 01/09/2019.   Specialty:  General Surgery Why:  Your follow up will be a phone call appointment  (due to the [facility] virus, to limit exposure.) Carlena Hurl will call you at 9:45AM  Please email a picture if you have an concerns with you wound to : [email or web address] @ [email or web address]  Contact information: [address] [place] [address] [phone]           Signed: Brigid Re, [facility] 12/25/2018, 1:34 PM Pager: [phone] Consults: [phone]

## 2018-12-27 LAB — AEROBIC/ANAEROBIC CULTURE W GRAM STAIN (SURGICAL/DEEP WOUND)

## 2019-01-24 ENCOUNTER — Encounter (HOSPITAL_BASED_OUTPATIENT_CLINIC_OR_DEPARTMENT_OTHER): Admission: RE | Payer: Self-pay | Source: Home / Self Care

## 2019-01-24 ENCOUNTER — Ambulatory Visit (HOSPITAL_BASED_OUTPATIENT_CLINIC_OR_DEPARTMENT_OTHER): Admission: RE | Admit: 2019-01-24 | Payer: Medicare Other | Source: Home / Self Care | Admitting: Surgery

## 2019-01-24 SURGERY — LAPAROSCOPIC CHOLECYSTECTOMY WITH INTRAOPERATIVE CHOLANGIOGRAM
Anesthesia: General

## 2019-10-24 ENCOUNTER — Ambulatory Visit: Payer: Medicare Other | Attending: Internal Medicine

## 2019-10-24 DIAGNOSIS — Z23 Encounter for immunization: Secondary | ICD-10-CM | POA: Insufficient documentation

## 2019-10-24 NOTE — Progress Notes (Signed)
   Covid-19 Vaccination Clinic  Name:  SHELI DINGESS    MRN: [id number] DOB: [birth year]  10/24/2019  Ms. Syring was observed post Covid-19 immunization for 15 minutes without incidence. She was provided with Vaccine Information Sheet and instruction to access the V-Safe system.   Ms. Petrosyan was instructed to call 911 with any severe reactions post vaccine: Marland Kitchen Difficulty breathing  . Swelling of your face and throat  . A fast heartbeat  . A bad rash all over your body  . Dizziness and weakness    Immunizations Administered    Name Date Dose VIS Date Route   Pfizer COVID-19 Vaccine 10/24/2019  6:36 PM 0.3 mL 09/14/2019 Intramuscular   Manufacturer: [place]   Lot: [id number]   [place]: [id number]

## 2019-11-12 ENCOUNTER — Ambulatory Visit: Payer: Medicare Other | Attending: Internal Medicine

## 2019-11-12 DIAGNOSIS — Z23 Encounter for immunization: Secondary | ICD-10-CM | POA: Insufficient documentation

## 2019-11-12 NOTE — Progress Notes (Signed)
   Covid-19 Vaccination Clinic  Name:  Beth Black    MRN: [id number] DOB: 10-24-1945  11/12/2019  [name] was observed post Covid-19 immunization for 15 minutes without incidence. She was provided with Vaccine Information Sheet and instruction to access the V-Safe system.   [name] was instructed to call 911 with any severe reactions post vaccine: Marland Kitchen Difficulty breathing  . Swelling of your face and throat  . A fast heartbeat  . A bad rash all over your body  . Dizziness and weakness    Immunizations Administered    Name Date Dose VIS Date Route   Pfizer COVID-19 Vaccine 11/12/2019  8:47 AM 0.3 mL 09/14/2019 Intramuscular   Manufacturer: [place]   Lot: [id number]   [place]: [id number]

## 2019-11-23 ENCOUNTER — Ambulatory Visit: Payer: Medicare Other

## 2024-05-30 ENCOUNTER — Emergency Department (HOSPITAL_COMMUNITY)
Admission: EM | Admit: 2024-05-30 | Discharge: 2024-05-30 | Disposition: A | Discharge status: Home / Self Care | Source: Ambulatory Visit | Attending: Emergency Medicine | Admitting: Emergency Medicine

## 2024-05-30 ENCOUNTER — Emergency Department (HOSPITAL_COMMUNITY)

## 2024-05-30 ENCOUNTER — Other Ambulatory Visit: Payer: Self-pay

## 2024-05-30 ENCOUNTER — Encounter (HOSPITAL_COMMUNITY): Payer: Self-pay

## 2024-05-30 DIAGNOSIS — R21 Rash and other nonspecific skin eruption: Secondary | ICD-10-CM | POA: Insufficient documentation

## 2024-05-30 DIAGNOSIS — R110 Nausea: Secondary | ICD-10-CM | POA: Insufficient documentation

## 2024-05-30 DIAGNOSIS — R945 Abnormal results of liver function studies: Secondary | ICD-10-CM | POA: Diagnosis not present

## 2024-05-30 DIAGNOSIS — R509 Fever, unspecified: Secondary | ICD-10-CM | POA: Diagnosis not present

## 2024-05-30 DIAGNOSIS — R7989 Other specified abnormal findings of blood chemistry: Secondary | ICD-10-CM

## 2024-05-30 DIAGNOSIS — R109 Unspecified abdominal pain: Secondary | ICD-10-CM | POA: Diagnosis present

## 2024-05-30 DIAGNOSIS — R1013 Epigastric pain: Secondary | ICD-10-CM | POA: Insufficient documentation

## 2024-05-30 DIAGNOSIS — R197 Diarrhea, unspecified: Secondary | ICD-10-CM | POA: Insufficient documentation

## 2024-05-30 LAB — CBC
HCT: 40.5 % (ref 36.0–46.0)
Hemoglobin: 13.6 g/dL (ref 12.0–15.0)
MCH: 30.9 pg (ref 26.0–34.0)
MCHC: 33.6 g/dL (ref 30.0–36.0)
MCV: 92 fL (ref 80.0–100.0)
Platelets: 156 K/uL (ref 150–400)
RBC: 4.4 MIL/uL (ref 3.87–5.11)
RDW: 12.4 % (ref 11.5–15.5)
WBC: 5.1 K/uL (ref 4.0–10.5)
nRBC: 0 % (ref 0.0–0.2)

## 2024-05-30 LAB — URINALYSIS, W/ REFLEX TO CULTURE (INFECTION SUSPECTED)
Bacteria, UA: NONE SEEN
Bilirubin Urine: NEGATIVE
Glucose, UA: NEGATIVE mg/dL
Hgb urine dipstick: NEGATIVE
Ketones, ur: 5 mg/dL — AB
Nitrite: NEGATIVE
Protein, ur: NEGATIVE mg/dL
Specific Gravity, Urine: 1.015 (ref 1.005–1.030)
pH: 5 (ref 5.0–8.0)

## 2024-05-30 LAB — HEPATITIS PANEL, ACUTE
HCV Ab: NONREACTIVE
Hep A IgM: NONREACTIVE
Hep B C IgM: NONREACTIVE
Hepatitis B Surface Ag: NONREACTIVE

## 2024-05-30 LAB — COMPREHENSIVE METABOLIC PANEL WITH GFR
ALT: 318 U/L — ABNORMAL HIGH (ref 0–44)
AST: 215 U/L — ABNORMAL HIGH (ref 15–41)
Albumin: 4 g/dL (ref 3.5–5.0)
Alkaline Phosphatase: 301 U/L — ABNORMAL HIGH (ref 38–126)
Anion gap: 16 — ABNORMAL HIGH (ref 5–15)
BUN: 9 mg/dL (ref 8–23)
CO2: 19 mmol/L — ABNORMAL LOW (ref 22–32)
Calcium: 9.2 mg/dL (ref 8.9–10.3)
Chloride: 103 mmol/L (ref 98–111)
Creatinine, Ser: 0.62 mg/dL (ref 0.44–1.00)
GFR, Estimated: >60 mL/min (ref >60)
Glucose, Bld: 101 mg/dL — ABNORMAL HIGH (ref 70–99)
Potassium: 3.8 mmol/L (ref 3.5–5.1)
Sodium: 138 mmol/L (ref 135–145)
Total Bilirubin: 3.8 mg/dL — ABNORMAL HIGH (ref 0.0–1.2)
Total Protein: 6.7 g/dL (ref 6.5–8.1)

## 2024-05-30 LAB — RESP PANEL BY RT-PCR (RSV, FLU A&B, COVID)  RVPGX2
Influenza A by PCR: NEGATIVE
Influenza B by PCR: NEGATIVE
Resp Syncytial Virus by PCR: NEGATIVE
SARS Coronavirus 2 by RT PCR: NEGATIVE

## 2024-05-30 LAB — PROTIME-INR
INR: 1 (ref 0.8–1.2)
Prothrombin Time: 13.4 s (ref 11.4–15.2)

## 2024-05-30 LAB — I-STAT CG4 LACTIC ACID, ED: Lactic Acid, Venous: 0.7 mmol/L (ref 0.5–1.9)

## 2024-05-30 LAB — LIPASE, BLOOD: Lipase: 21 U/L (ref 11–51)

## 2024-05-30 MED ORDER — LACTATED RINGERS IV BOLUS
1000.0000 mL | Freq: Once | INTRAVENOUS | Status: AC
  Administered 2024-05-30: 1000 mL via INTRAVENOUS

## 2024-05-30 MED ORDER — IBUPROFEN 200 MG PO TABS
400.0000 mg | ORAL_TABLET | Freq: Once | ORAL | Status: AC
  Administered 2024-05-30: 400 mg via ORAL
  Filled 2024-05-30: qty 2

## 2024-05-30 MED ORDER — IOHEXOL 300 MG/ML  SOLN
100.0000 mL | Freq: Once | INTRAMUSCULAR | Status: AC | PRN
  Administered 2024-05-30: 100 mL via INTRAVENOUS

## 2024-05-30 NOTE — ED Provider Notes (Signed)
 [place] EMERGENCY DEPARTMENT AT [facility] Provider Note   CSN: [id number] Arrival date & time: 05/30/24  1030     History Chief Complaint  Patient presents with   Abdominal Pain     Abdominal Pain  HPI: Beth Black is a 78 y.o. female with history perinent for psoriasis, prior cholecystectomy who presents complaining of abdominal pain, diarrhea, fever. Patient arrived via POV accompanied by husband.  History provided by patient and spouse.  No interpreter required during this encounter.  Patient reports that from 8/25 through 8/26 she had constant severe epigastric pain with associated nausea without vomiting.  Reports that there are no specific aggravating or alleviating factors, despite nausea she was able to tolerate p.o. and feels that she has been eating normally.  Reports that she has had decreased urine output, and urine has been particularly dark in color, and states that when she urinates it feels hot, however denies dysuria, hematuria.  Endorses diarrhea for several days. Denies back pain, chest pain, shortness of breath, leg swelling, leg pain.  Reports that the pain resolved spontaneously, and she currently is free from abdominal pain, however notes that overnight she had a fever to 101.2, therefore she became concerned, and wanted to come to the emergency department for evaluation.  Patient expresses concern that her symptoms could be due to her liver healing.  Husband at bedside feels that patient's skin color and sclera are at baseline coloration.  Patient's recorded medical, surgical, social, medication list and allergies were reviewed in the Snapshot window as part of the initial history.   Prior to Admission medications   Medication Sig Start Date End Date Taking? Authorizing Provider  acetaminophen  (TYLENOL ) 500 MG tablet You can take 2 tablets every 8 hours as needed for pain.  This will be your primary pain medication.  Use this as your  first pain control medicine. You can buy this over-the-counter at any drugstore.  Do not take more than 4000 mg of Tylenol  per day it can harm your liver. 12/25/18   Tonnie George, PA-C  cycloSPORINE  (RESTASIS ) 0.05 % ophthalmic emulsion Place 1 drop into both eyes 2 (two) times daily.     [provider]  dicyclomine  (BENTYL ) 20 MG tablet Take 1 tablet (20 mg total) by mouth every 8 (eight) hours as needed for spasms (Abdominal cramping). 12/13/18   Ward, Josette N, DO  ELDERBERRY PO Take 2 tablets by mouth daily.    [provider]  escitalopram  (LEXAPRO ) 20 MG tablet Take 20 mg by mouth daily.    [provider]  ezetimibe (ZETIA) 10 MG tablet Take 10 mg by mouth daily.    [provider]  Fish Oil-Vitamin D 1000-1000 MG-UNIT CAPS Take 3 capsules by mouth daily.    [provider]  omeprazole  (PRILOSEC) 40 MG capsule Take 1 capsule (40 mg total) by mouth daily. 12/12/18   Eda Iha, MD  ondansetron  (ZOFRAN  ODT) 4 MG disintegrating tablet Take 1 tablet (4 mg total) by mouth every 6 (six) hours as needed for nausea or vomiting. 12/13/18   Ward, Josette SAILOR, DO  oxyCODONE  (OXY IR/ROXICODONE ) 5 MG immediate release tablet Take 1 tablet (5 mg total) by mouth every 6 (six) hours as needed for moderate pain or severe pain. 12/25/18   Tonnie George, PA-C     Allergies: Biaxin [clarithromycin], Doxycycline, and Talwin [pentazocine]   Review of Systems   ROS as per HPI  Physical Exam Updated Vital Signs BP ROLLEN)  145/94   Pulse 73   Temp (!) 97.5 F (36.4 C) (Oral)   Resp 18   Ht 5' 5 (1.651 m)   Wt 79.4 kg   SpO2 98%   BMI 29.12 kg/m  Physical Exam Vitals and nursing note reviewed.  Constitutional:      General: She is not in acute distress.    Appearance: She is well-developed.  HENT:     Head: Normocephalic and atraumatic.  Eyes:     General: No scleral icterus.    Conjunctiva/sclera: Conjunctivae normal.  Cardiovascular:      Rate and Rhythm: Normal rate and regular rhythm.     Heart sounds: No murmur heard. Pulmonary:     Effort: Pulmonary effort is normal. No respiratory distress.     Breath sounds: Normal breath sounds.  Abdominal:     Palpations: Abdomen is soft.     Tenderness: There is abdominal tenderness (Mild epigastric tenderness) in the epigastric area. There is no right CVA tenderness, left CVA tenderness, guarding or rebound. Negative signs include Murphy's sign.  Musculoskeletal:        General: No swelling.     Cervical back: Neck supple.  Skin:    General: Skin is warm and dry.     Capillary Refill: Capillary refill takes less than 2 seconds.     Coloration: Skin is not jaundiced.     Findings: Rash (Diffuse erythematous plaques which patient report are her baseline psoriasis) present.  Neurological:     Mental Status: She is alert.     Motor: No weakness.  Psychiatric:        Mood and Affect: Mood normal.     ED Course/ Medical Decision Making/ A&P    Procedures Procedures   Medications Ordered in ED Medications  lactated ringers  bolus 1,000 mL (0 mLs Intravenous Stopped 05/30/24 1727)  iohexol  (OMNIPAQUE ) 300 MG/ML solution 100 mL (100 mLs Intravenous Contrast Given 05/30/24 1344)  ibuprofen  (ADVIL ) tablet 400 mg (400 mg Oral Given 05/30/24 1727)    Medical Decision Making:   [name] is a 78 y.o. female who presents for multiple complaints as per above.  Physical exam is pertinent for mild left right upper quadrant tenderness to palpation.   The differential includes but is not limited to sepsis, cholangitis, gastroenteritis, viral hepatitis, hepatitis, cirrhosis, Tylenol  overdose, pancreatitis, malignancy.  Independent historian: Spouse/partner  External data reviewed: Labs: Reviewed patient's prior labs for baseline values  Labs: Ordered, Independent interpretation, and Details: CBC without leukocytosis, anemia, thrombocytopenia.  CMP without emergent  electrolyte derangement, mildly diminished bicarb at 19.  No AKI.  Elevation of LFTs with AST 215, ALT 318, alk phos 300, T. bili 3.8, patient previously has had WNL LFTs.  Lactic acid WNL at 0.7.  PT/INR WNL.  Blood cultures in process.  UA without evidence of UTI or bilirubin.  COVID/flu/RSV negative.  Radiology: Ordered, Independent interpretation, Details: Chest x-ray without focal airspace opacification, cardiomediastinal silhouette gentian, pneumothorax, pleural effusion, bony derangement.  CT chest without focal airspace consolidation or appreciable mass lesion.  CT of the abdomen pelvis without significant free fluid, free air, obstructive bowel gas pattern, fluid collection, mass lesion., and All images reviewed independently.  Agree with radiology report at this time.   CT ABDOMEN PELVIS W CONTRAST Result Date: 05/30/2024 CLINICAL DATA:  Abdominal pain, acute, nonlocalized; Abnormal xray - adenopathy. EXAM: CT CHEST, ABDOMEN, AND PELVIS WITH CONTRAST TECHNIQUE: Multidetector CT imaging of the chest, abdomen and pelvis was performed following  the standard protocol during bolus administration of intravenous contrast. RADIATION DOSE REDUCTION: This exam was performed according to the departmental dose-optimization program which includes automated exposure control, adjustment of the mA and/or kV according to patient size and/or use of iterative reconstruction technique. CONTRAST:  OMNIPAQUE  IOHEXOL  300 MG/ML  SOLN COMPARISON:  CT angiography chest from 03/23/2006 and MRI abdomen from 12/22/2018. FINDINGS: CT CHEST FINDINGS Cardiovascular: Normal cardiac size. No pericardial effusion. No aortic aneurysm. There are mild peripheral atherosclerotic vascular calcifications of thoracic aorta and its major branches. Mediastinum/Nodes: Visualized thyroid gland appears grossly unremarkable. No solid / cystic mediastinal masses. The esophagus is nondistended precluding optimal assessment. No axillary,  mediastinal or hilar lymphadenopathy by size criteria. Lungs/Pleura: The central tracheo-bronchial tree is patent. There is mild, smooth, circumferential thickening of the segmental and subsegmental bronchial walls, throughout bilateral lungs, which is nonspecific. Findings are most commonly seen with bronchitis or reactive airway disease, such as asthma. There are multiple scattered sub 4 mm centrilobular nodules throughout bilateral lungs. This is nonspecific and differential diagnosis includes bronchiolitis, infection, etc. No mass or consolidation. No pleural effusion or pneumothorax. No suspicious lung nodules. Musculoskeletal: The visualized soft tissues of the chest wall are grossly unremarkable. No suspicious osseous lesions. There are mild multilevel degenerative changes in the visualized spine. Multiple vertebral body hemangiomas noted including T7, T9, T12 and L1 vertebrae. CT ABDOMEN PELVIS FINDINGS Hepatobiliary: The liver is normal in size. Non-cirrhotic configuration. No suspicious mass. No intrahepatic or extrahepatic bile duct dilation. Left hepatic lobe pneumobilia noted, likely for prior sphincterotomy. Gallbladder is surgically absent. Pancreas: Unremarkable. No pancreatic ductal dilatation or surrounding inflammatory changes. Spleen: Within normal limits. No focal lesion. Adrenals/Urinary Tract: Adrenal glands are unremarkable. No suspicious renal mass. No hydronephrosis. No renal or ureteric calculi. Urinary bladder is under distended, precluding optimal assessment. However, no large mass or stones identified. No perivesical fat stranding. Stomach/Bowel: No disproportionate dilation of the small or large bowel loops. No evidence of abnormal bowel wall thickening or inflammatory changes. The appendix is unremarkable. There are multiple diverticula mainly in the sigmoid colon, without imaging signs of diverticulitis. Vascular/Lymphatic: No ascites or pneumoperitoneum. No abdominal or pelvic  lymphadenopathy, by size criteria. No aneurysmal dilation of the major abdominal arteries. There are mild peripheral atherosclerotic vascular calcifications of the aorta and its major branches. Reproductive: The uterus is unremarkable. No large adnexal mass. Other: The visualized soft tissues and abdominal wall are unremarkable. Musculoskeletal: No suspicious osseous lesions. There are mild multilevel degenerative changes in the visualized spine. Transitional lumbosacral element noted. There is hemangioma in the S1 vertebrae. IMPRESSION: 1. There are multiple scattered sub 4 mm centrilobular nodules throughout bilateral lungs. This is nonspecific and differential diagnosis includes bronchiolitis, infection, etc. 2. No lymphadenopathy by size criteria within the chest, abdomen or pelvis. Apparent fullness in the right hilum seen on the earlier radiograph corresponds to vascular structures. 3. No acute inflammatory process identified within the abdomen or pelvis. 4. Multiple other nonacute observations, as described above. Aortic Atherosclerosis (ICD10-I70.0). Electronically Signed   By: Ree Molt M.D.   On: 05/30/2024 14:24   CT Chest W Contrast Result Date: 05/30/2024 CLINICAL DATA:  Abdominal pain, acute, nonlocalized; Abnormal xray - adenopathy. EXAM: CT CHEST, ABDOMEN, AND PELVIS WITH CONTRAST TECHNIQUE: Multidetector CT imaging of the chest, abdomen and pelvis was performed following the standard protocol during bolus administration of intravenous contrast. RADIATION DOSE REDUCTION: This exam was performed according to the departmental dose-optimization program which includes automated exposure control, adjustment  of the mA and/or kV according to patient size and/or use of iterative reconstruction technique. CONTRAST:  OMNIPAQUE  IOHEXOL  300 MG/ML  SOLN COMPARISON:  CT angiography chest from 03/23/2006 and MRI abdomen from 12/22/2018. FINDINGS: CT CHEST FINDINGS Cardiovascular: Normal cardiac size.  No pericardial effusion. No aortic aneurysm. There are mild peripheral atherosclerotic vascular calcifications of thoracic aorta and its major branches. Mediastinum/Nodes: Visualized thyroid gland appears grossly unremarkable. No solid / cystic mediastinal masses. The esophagus is nondistended precluding optimal assessment. No axillary, mediastinal or hilar lymphadenopathy by size criteria. Lungs/Pleura: The central tracheo-bronchial tree is patent. There is mild, smooth, circumferential thickening of the segmental and subsegmental bronchial walls, throughout bilateral lungs, which is nonspecific. Findings are most commonly seen with bronchitis or reactive airway disease, such as asthma. There are multiple scattered sub 4 mm centrilobular nodules throughout bilateral lungs. This is nonspecific and differential diagnosis includes bronchiolitis, infection, etc. No mass or consolidation. No pleural effusion or pneumothorax. No suspicious lung nodules. Musculoskeletal: The visualized soft tissues of the chest wall are grossly unremarkable. No suspicious osseous lesions. There are mild multilevel degenerative changes in the visualized spine. Multiple vertebral body hemangiomas noted including T7, T9, T12 and L1 vertebrae. CT ABDOMEN PELVIS FINDINGS Hepatobiliary: The liver is normal in size. Non-cirrhotic configuration. No suspicious mass. No intrahepatic or extrahepatic bile duct dilation. Left hepatic lobe pneumobilia noted, likely for prior sphincterotomy. Gallbladder is surgically absent. Pancreas: Unremarkable. No pancreatic ductal dilatation or surrounding inflammatory changes. Spleen: Within normal limits. No focal lesion. Adrenals/Urinary Tract: Adrenal glands are unremarkable. No suspicious renal mass. No hydronephrosis. No renal or ureteric calculi. Urinary bladder is under distended, precluding optimal assessment. However, no large mass or stones identified. No perivesical fat stranding. Stomach/Bowel: No  disproportionate dilation of the small or large bowel loops. No evidence of abnormal bowel wall thickening or inflammatory changes. The appendix is unremarkable. There are multiple diverticula mainly in the sigmoid colon, without imaging signs of diverticulitis. Vascular/Lymphatic: No ascites or pneumoperitoneum. No abdominal or pelvic lymphadenopathy, by size criteria. No aneurysmal dilation of the major abdominal arteries. There are mild peripheral atherosclerotic vascular calcifications of the aorta and its major branches. Reproductive: The uterus is unremarkable. No large adnexal mass. Other: The visualized soft tissues and abdominal wall are unremarkable. Musculoskeletal: No suspicious osseous lesions. There are mild multilevel degenerative changes in the visualized spine. Transitional lumbosacral element noted. There is hemangioma in the S1 vertebrae. IMPRESSION: 1. There are multiple scattered sub 4 mm centrilobular nodules throughout bilateral lungs. This is nonspecific and differential diagnosis includes bronchiolitis, infection, etc. 2. No lymphadenopathy by size criteria within the chest, abdomen or pelvis. Apparent fullness in the right hilum seen on the earlier radiograph corresponds to vascular structures. 3. No acute inflammatory process identified within the abdomen or pelvis. 4. Multiple other nonacute observations, as described above. Aortic Atherosclerosis (ICD10-I70.0). Electronically Signed   By: Ree Molt M.D.   On: 05/30/2024 14:24   DG Chest Port 1 View Result Date: 05/30/2024 CLINICAL DATA:  Questionable sepsis - evaluate for abnormality. EXAM: PORTABLE CHEST 1 VIEW COMPARISON:  03/17/2014. FINDINGS: There is asymmetric prominence/density of the right hilum, which may due to vascular structures however, contrast-enhanced CT scan is recommended to exclude underlying neoplastic process/lymphadenopathy. Bilateral lung fields are otherwise clear. Bilateral lateral costophrenic angles are  clear. Normal cardio-mediastinal silhouette. No acute osseous abnormalities. The soft tissues are within normal limits. IMPRESSION: *There is asymmetric prominence/density of the right hilum, which may due to vascular structures however, contrast-enhanced  CT scan is recommended to exclude underlying neoplastic process/lymphadenopathy. Electronically Signed   By: Ree Molt M.D.   On: 05/30/2024 11:54    EKG/Medicine tests: Ordered and Independent interpretation EKG Interpretation: Rate 78, sinus rhythm, wandering baseline, no ST elevations or depressions, nonspecific T wave inversion in lead V1, similar to prior from March 2020                Interventions: LR bolus, Tylenol   See the EMR for full details regarding lab and imaging results.  Patient presents to the emergency department for evaluation of sensation of warm urine without dysuria, 2 days of epigastric pain and diarrhea which have resolved prior to arrival, and objective fever measured overnight at home.  Patient overall well-appearing on exam with minimal epigastric tenderness and otherwise reassuring exam and vitals, however given patient's age, reported objectively measured fever, and GI symptoms, do feel that patient warrants broad workup including labs for suspected sepsis, in addition to CT of the abdomen and pelvis.  Screening chest x-ray as part of suspected sepsis workup reveals right hilar fullness, CT of the chest recommended by radiology and thus ordered.  This was obtained and does not demonstrate mass lesion, however does note numerous 4 mm nodules diffusely.  Discussed this finding with patient and husband at bedside, recommended follow-up with PCP with consideration for repeat scan in 6 months to 1 year to evaluate for stability/resolution.  Patient and husband expressed understanding.  Labs overall reassuring, patient without lactic acid elevation, no leukocytosis, which is supportive against sepsis, shock, underlying  significant infection, or GI ischemia.  No anemia which is supportive against blood loss.  UA without evidence of UTI, trace LE, however no nitrites, no bacteria, this in the absence of dysuria, suprapubic tenderness, CVA tenderness, do not feel the patient requires intervention for trace LE.  Lipase WNL, doubt pancreatitis.  Respiratory viral panel negative for COVID, flu, RSV.  Blood cultures ordered as part of suspected sepsis workup, pending.  Coags WNL.  No emergent electrolyte derangements or AKI suggesting no significant dehydration from volume losses with diarrhea.  Patient does have mildly elevated LFTs.  Did consider Tylenol  toxicity, however patient reports that she took a single Tylenol  yesterday, and no other recent Tylenol  intake, therefore do not feel that Tylenol  is contributing to LFTs.  Patient does endorse occasional intake of wine, however patient does not report significant intake, and ALT greater than AST on patient's lab is not consistent with alcohol related hepatitis.  Additionally, consider cholangitis, however patient is currently afebrile, no focal right upper quadrant tenderness, therefore do not feel that this is ongoing.  While patient does have elevation of LFTs, overall mild, and has normal platelet count as well as coags, suggesting intact synthetic function of the liver.  Did order hepatitis panel as patient reports recent intake of seafood, and had recent GI symptoms indicating possible viral hepatitis.  This was pending at the time of discharge, do not feel that patient required results of this panel prior to discharge given there is no emergent treatment for hepatitis A, and patient denies risk factors for hepatitis B and C.  Overall, patient's mildly LFTs do raise concern.  However remainder of patient's labs are reassuring including negative lactic, normal WBC, normal hepatic synthetic function, no other abnormal labs, reassuring scans without evidence of etiology to raise  LFTs.  Additionally patient was under my care for numerous hours in the emergency department and serial vitals performed and patient remained normotensive,  nontachycardic, with normal respiratory rate, oxygen saturation and was afebrile.  Additionally patient's symptoms of abdominal pain and diarrhea had resolved spontaneously prior to presentation to the emergency department, thus while etiology of symptoms and mild LFT elevations are not fully elucidated, given remainder of patient's presentation and workup is reassuring, do feel that patient is stable for continued workup outpatient.  Discussed this extensively with patient and husband at bedside.  Discussed following up results of their hepatitis panel on MyChart.  Discussed strict return precaution for any recurrent, new, worsening symptoms.  Discussed need for follow-up with PCP within 1 week for repeat LFTs, and additionally referral placed for gastroenterology follow-up.  Patient and husband comfortable with this plan, discharged in stable condition.  Presentation is most consistent with acute complicated illness  Discussion of management or test interpretations with external provider(s): Not indicated  Risk Drugs:OTC drugs and Prescription drug management  Disposition: DISCHARGE: I believe that the patient is safe for discharge home with outpatient follow-up. Patient was informed of all pertinent physical exam, laboratory, and imaging findings including elevated LFTs, the etiology of which are not elucidated by remainder of workup, as well as multiple pulmonary nodules that require PCP follow-up.  Patient's suspected etiology of their symptom presentation was discussed with the patient and all questions were answered. We discussed following up with PCP, gastroenterology. I provided thorough ED return precautions. The patient feels safe and comfortable with this plan.  MDM generated using voice dictation software and may contain dictation errors.   Please contact me for any clarification or with any questions.  Clinical Impression:  1. Epigastric pain   2. LFT elevation      Discharge   Final Clinical Impression(s) / ED Diagnoses Final diagnoses:  Epigastric pain  LFT elevation    Rx / DC Orders ED Discharge Orders          Ordered    Ambulatory referral to Gastroenterology        05/30/24 1804             Rogelia Jerilynn RAMAN, MD 05/31/24 1157

## 2024-05-30 NOTE — ED Notes (Signed)
 Initial lactic within normal limits, no indication for repeat lactic acid according to sepsis sidebar.

## 2024-05-30 NOTE — ED Provider Triage Note (Signed)
 Emergency Medicine Provider Triage Evaluation Note  TASHONNA DESCOTEAUX , a 78 y.o. female  was evaluated in triage.  Pt complains of abdominal pain.  No nausea or vomiting.  No dysuria or increased frequency.  Review of Systems  Positive:  Negative:  Physical Exam  BP (!) 159/82 (BP Location: Right Arm)   Pulse 73   Temp 97.8 F (36.6 C) (Oral)   Resp 16   Ht 5' 5 (1.651 m)   Wt 79.4 kg   SpO2 98%   BMI 29.12 kg/m  Gen:   Awake, no distress   Resp:  Normal effort  MSK:   Moves extremities without difficulty  Other:  Abdomen tender epigastric  Medical Decision Making  Medically screening exam initiated at 11:01 AM.  Appropriate orders placed.  Sareen H Frieze was informed that the remainder of the evaluation will be completed by another provider, this initial triage assessment does not replace that evaluation, and the importance of remaining in the ED until their evaluation is complete.     Donnajean Lynwood VEAR, PA-C 05/30/24 1102

## 2024-05-30 NOTE — Discharge Instructions (Signed)
 Beth Black  Thank you for allowing us  to take care of you today.  You came to the Emergency Department today because you passed 48 hours you had severe abdominal pain, nausea, as well as diarrhea, and intermittent fever.  The emergency department with vital signs have stayed normal with normal blood pressure, heart rate, oxygen.  We did a broad lab workup including a uric acid level, which is a marker of severe infection or inflammation, and this was normal.  Your blood counts were also normal as were your blood salts and kidney number.  You did have some nonspecific elevation of your liver numbers.  Is unclear what exactly is causing them, we do have a hepatitis panel pending, you can follow-up these results on your MyChart in approximately 24 to 48 hours.  Given your labs, vitals, exam are all reassuring, and your symptoms are better than they have been over the past several days, we feel that you are safe to go home and follow-up outpatient.  We recommend avoiding any substances that may cause liver damage such as Tylenol  and alcohol.  We have given you referral to follow-up with a gastroenterologist.  We recommend following up with your primary care doctor within the next week for a recheck of your liver numbers to make sure they are stable to improving rather than worsening.  To-Do: 1. Please follow-up with your primary doctor within 1 week/ as soon as possible.    We are giving you a referral to see a gastroenterologist (a doctor who specializes in disorders at the GI organ such as a liver). Schedules should call you in the next couple of days to schedule an appointment. If they do not call you after two days, you should call [phone].   Please return to the Emergency Department or call 911 if you experience have worsening of your symptoms, or do not get better, recurrent severe abdominal pain, difficulty with urination, vomiting, persistent fever, chest pain, shortness of  breath, severe or significantly worsening pain, high fever, severe confusion, pass out or have any reason to think that you need emergency medical care.   We hope you feel better soon.   Mitzie Later, MD Department of Emergency Medicine [facility]

## 2024-05-30 NOTE — ED Triage Notes (Signed)
 Pt presents to ED from home C/O upper abdominal pain, nausea, diarrhea, fever X 2 days. Denies vomiting.

## 2024-06-01 ENCOUNTER — Encounter: Payer: Self-pay | Admitting: Gastroenterology

## 2024-06-01 ENCOUNTER — Other Ambulatory Visit

## 2024-06-01 ENCOUNTER — Ambulatory Visit: Admitting: Gastroenterology

## 2024-06-01 ENCOUNTER — Ambulatory Visit: Payer: Self-pay | Admitting: Gastroenterology

## 2024-06-01 VITALS — BP 150/90 | HR 84 | Ht 65.5 in | Wt 171.1 lb

## 2024-06-01 DIAGNOSIS — R7989 Other specified abnormal findings of blood chemistry: Secondary | ICD-10-CM

## 2024-06-01 DIAGNOSIS — R945 Abnormal results of liver function studies: Secondary | ICD-10-CM | POA: Diagnosis not present

## 2024-06-01 LAB — CBC WITH DIFFERENTIAL/PLATELET
Basophils Absolute: 0 K/uL (ref 0.0–0.1)
Basophils Relative: 0.6 % (ref 0.0–3.0)
Eosinophils Absolute: 0.3 K/uL (ref 0.0–0.7)
Eosinophils Relative: 7 % — ABNORMAL HIGH (ref 0.0–5.0)
HCT: 43.2 % (ref 36.0–46.0)
Hemoglobin: 14.6 g/dL (ref 12.0–15.0)
Lymphocytes Relative: 21.8 % (ref 12.0–46.0)
Lymphs Abs: 0.9 K/uL (ref 0.7–4.0)
MCHC: 33.8 g/dL (ref 30.0–36.0)
MCV: 90.8 fl (ref 78.0–100.0)
Monocytes Absolute: 0.3 K/uL (ref 0.1–1.0)
Monocytes Relative: 8 % (ref 3.0–12.0)
Neutro Abs: 2.6 K/uL (ref 1.4–7.7)
Neutrophils Relative %: 62.6 % (ref 43.0–77.0)
Platelets: 211 K/uL (ref 150.0–400.0)
RBC: 4.76 Mil/uL (ref 3.87–5.11)
RDW: 12.6 % (ref 11.5–15.5)
WBC: 4.2 K/uL (ref 4.0–10.5)

## 2024-06-01 LAB — COMPREHENSIVE METABOLIC PANEL WITH GFR
ALT: 141 U/L — ABNORMAL HIGH (ref 0–35)
AST: 45 U/L — ABNORMAL HIGH (ref 0–37)
Albumin: 4.3 g/dL (ref 3.5–5.2)
Alkaline Phosphatase: 214 U/L — ABNORMAL HIGH (ref 39–117)
BUN: 12 mg/dL (ref 6–23)
CO2: 29 meq/L (ref 19–32)
Calcium: 8.9 mg/dL (ref 8.4–10.5)
Chloride: 104 meq/L (ref 96–112)
Creatinine, Ser: 0.62 mg/dL (ref 0.40–1.20)
GFR: 86.2 mL/min (ref >60.00)
Glucose, Bld: 99 mg/dL (ref 70–99)
Potassium: 4 meq/L (ref 3.5–5.1)
Sodium: 141 meq/L (ref 135–145)
Total Bilirubin: 1 mg/dL (ref 0.2–1.2)
Total Protein: 6.9 g/dL (ref 6.0–8.3)

## 2024-06-01 LAB — LIPASE: Lipase: 12 U/L (ref 11.0–59.0)

## 2024-06-01 LAB — PROTIME-INR
INR: 1 ratio (ref 0.8–1.0)
Prothrombin Time: 11.1 s (ref 9.6–13.1)

## 2024-06-01 NOTE — Progress Notes (Signed)
 Chief Complaint:ED follow-up, abdominal pain, diarrhea, fever.  Primary GI Doctor: Dr. Charlanne  HPI:  Patient is a  78  year old female patient with past medical history of psoriasis,depression, hyperlipidemia, prior cholecystectomy, who was referred to me by [facility] hospital on 05/30/24 for a evaluation of abdominal pain, diarrhea, fever.  .    05/30/24 seen in ED for abdominal pain, diarrhea, and fever. Labs show: lipase 21, BUN 9, Creat 0.62, total bili 3.8, alk phos 301, ALT 318, AST 215, WBC 5.1, Hgb 13.6, PLT 156, PT 13.4/INR 1 CT chest without focal airspace consolidation or appreciable mass lesion. CT of the abdomen pelvis without significant free fluid, free air, obstructive bowel gas pattern, fluid collection, mass lesion.   Interval History    Patient presents for follow-up after recent ED visit. Patient states on Monday and Tuesday she had severe RUQ abdominal pain that was as severe as when she had gallstones. She states she took two Tums and went NPO which helped it some. She also notes she had intermittent diarrhea over the last two weeks and noted it was darker on Tuesday and now is clay colored.  She notes her urine is yellow and darker than usual despite drinking a lot of water. She noted Tuesday she had low grade fever. No abdominal pain since Tuesday. Last BM was on Tuesday. She notes her normal is every 3-4 days. She notes her appetite has decreased. She notes they were at the beach 2 weeks prior, cannot note any acute illness. No exposure.  No new medications. No history of liver history. Evaluated for hepatitis at ED. Contacted her yesterday and told her it was all negative.  She also has rash all over body, she reports psoriasis. She reports rash is worse as of recent and has had a lot of itching. She was prescribed new cream by dermatologist.  She does drinks on weekends, she consumes 2 bottles from Thursday to Sunday. Nonsmoker.   Very seldom takes NSAID's.  Patient's  family history: mother with pancreatitis, no family hx of liver disease or CA  GI history: EGD 12/12/2018:Normal esophagus. Erythematous mucosa in the gastric body, normal bx. Erythematous duodenopathy bx showed duodenitis. Colonoscopy 12/12/2018: Multiple small and large mouth diverticula in the sigmoid colon, descending colon and ascending colon.  Surveillance colonoscopy in 10 years.  ERCP 12/2018: Choledocholithiasis s/ p biliary sphincterotomy and balloon extraction followed by post occlusion cholangiogram.  Wt Readings from Last 3 Encounters:  06/01/24 171 lb 2 oz (77.6 kg)  05/30/24 175 lb (79.4 kg)  12/25/18 175 lb 0.7 oz (79.4 kg)    Past Medical History:  Diagnosis Date   Depression    Hyperlipidemia    Past Surgical History:  Procedure Laterality Date   BILIARY DILATION  12/25/2018   Procedure: BILIARY DILATION;  Surgeon: Charlanne Groom, MD;  Location: [facility];  Service: Endoscopy;;   CHOLECYSTECTOMY N/A 12/22/2018   Procedure: LAPAROSCOPIC CHOLECYSTECTOMY WITH INTRAOPERATIVE CHOLANGIOGRAM;  Surgeon: Eletha Boas, MD;  Location: [facility];  Service: General;  Laterality: N/A;   ERCP N/A 12/25/2018   Procedure: ENDOSCOPIC RETROGRADE CHOLANGIOPANCREATOGRAPHY (ERCP);  Surgeon: Charlanne Groom, MD;  Location: [facility];  Service: Endoscopy;  Laterality: N/A;   REMOVAL OF STONES  12/25/2018   Procedure: REMOVAL OF STONES;  Surgeon: Charlanne Groom, MD;  Location: [facility];  Service: Endoscopy;;   SPHINCTEROTOMY  12/25/2018   Procedure: ANNETT;  Surgeon: Charlanne Groom, MD;  Location: [facility];  Service: Endoscopy;;   TOE SURGERY Bilateral  1990   5th toes    Current Outpatient Medications  Medication Sig Dispense Refill   betamethasone dipropionate 0.05 % cream Apply 1 Application topically 2 (two) times daily.     calcipotriene (DOVONOX) 0.005 % cream Apply 1 Application topically 2 (two) times daily.     CALCIUM PO Take 1 tablet by mouth daily.     clobetasol  cream (TEMOVATE) 0.05 % Apply 1 Application topically 2 (two) times daily.     ELDERBERRY PO Take 2 tablets by mouth daily.     escitalopram  (LEXAPRO ) 20 MG tablet Take 20 mg by mouth daily.     ezetimibe (ZETIA) 10 MG tablet Take 10 mg by mouth daily.     Fish Oil-Vitamin D 1000-1000 MG-UNIT CAPS Take 3 capsules by mouth daily.     Multiple Vitamins-Minerals (MACULAR HEALTH FORMULA PO) Take 4 capsules by mouth daily.     tacrolimus (PROTOPIC) 0.1 % ointment Apply 1 Application topically 2 (two) times daily.     No current facility-administered medications for this visit.    Allergies as of 06/01/2024 - Review Complete 06/01/2024  Allergen Reaction Noted   Biaxin [clarithromycin]  12/12/2013   Doxycycline Itching 09/13/2013   Statins  06/01/2024   Talwin [pentazocine] Other (See Comments) 09/13/2013    Family History  Problem Relation Age of Onset   Hypertension Mother    Alzheimer's disease Mother    Skin cancer Father    Liver disease Sister    Stroke Paternal Grandmother    Colon cancer Neg Hx     Review of Systems:    Constitutional: No weight loss, fever, chills, weakness or fatigue HEENT: Eyes: No change in vision               Ears, Nose, Throat:  No change in hearing or congestion Skin: No rash or itching Cardiovascular: No chest pain, chest pressure or palpitations   Respiratory: No SOB or cough Gastrointestinal: See HPI and otherwise negative Genitourinary: No dysuria or change in urinary frequency Neurological: No headache, dizziness or syncope Musculoskeletal: No new muscle or joint pain Hematologic: No bleeding or bruising Psychiatric: No history of depression or anxiety    Physical Exam:  Vital signs: BP (!) 150/90 (BP Location: Left Arm, Patient Position: Sitting, Cuff Size: Normal)   Pulse 84   Ht 5' 5.5 (1.664 m) Comment: height measured without shoes  Wt 171 lb 2 oz (77.6 kg)   BMI 28.04 kg/m   Constitutional:   Pleasant female appears to be in  NAD, Well developed, Well nourished, alert and cooperative Eyes:   PEERL, EOMI. No icterus. Conjunctiva pink. Neck:  Supple Throat: Oral cavity and pharynx without inflammation, swelling or lesion.  Respiratory: Respirations even and unlabored. Lungs clear to auscultation bilaterally.   No wheezes, crackles, or rhonchi.  Cardiovascular: Normal S1, S2. Regular rate and rhythm. No peripheral edema, cyanosis or pallor.  Gastrointestinal:  Soft, nondistended, nontender. No rebound or guarding. Normal bowel sounds. No appreciable masses or hepatomegaly. Rectal:  Not performed.  Msk:  Symmetrical without gross deformities. Without edema, no deformity or joint abnormality.  Neurologic:  Alert and  oriented x4;  grossly normal neurologically.  Skin:  rash all over body  RELEVANT LABS AND IMAGING: CBC    Latest Ref Rng & Units 05/30/2024   10:53 AM 12/25/2018    4:18 AM 12/23/2018    4:25 AM  CBC  WBC 4.0 - 10.5 K/uL 5.1  5.5  10.0   Hemoglobin  12.0 - 15.0 g/dL 86.3  88.6  88.4   Hematocrit 36.0 - 46.0 % 40.5  35.4  35.2   Platelets 150 - 400 K/uL 156  240  245      CMP     Latest Ref Rng & Units 05/30/2024   10:53 AM 12/25/2018    4:18 AM 12/23/2018    4:25 AM  CMP  Glucose 70 - 99 mg/dL 898  889  846   BUN 8 - 23 mg/dL 9  6  7    Creatinine 0.44 - 1.00 mg/dL 9.37  9.34  9.38   Sodium 135 - 145 mmol/L 138  140  139   Potassium 3.5 - 5.1 mmol/L 3.8  3.9  4.4   Chloride 98 - 111 mmol/L 103  107  107   CO2 22 - 32 mmol/L 19  27  25    Calcium 8.9 - 10.3 mg/dL 9.2  8.4  8.4   Total Protein 6.5 - 8.1 g/dL 6.7  5.8  6.3   Total Bilirubin 0.0 - 1.2 mg/dL 3.8  0.5  0.8   Alkaline Phos 38 - 126 U/L 301  85  77   AST 15 - 41 U/L 215  23  41   ALT 0 - 44 U/L 318  36  47     Lab Results  Component Value Date   INR 1.0 05/30/2024  05/30/24 hepatitis panel, acute - nonreactive   05/30/24 CTAP IMPRESSION: 1. There are multiple scattered sub 4 mm centrilobular nodules throughout bilateral lungs.  This is nonspecific and differential diagnosis includes bronchiolitis, infection, etc. 2. No lymphadenopathy by size criteria within the chest, abdomen or pelvis. Apparent fullness in the right hilum seen on the earlier radiograph corresponds to vascular structures. 3. No acute inflammatory process identified within the abdomen or pelvis. 4. Multiple other nonacute observations, as described above.   Aortic Atherosclerosis (ICD10-I70.0).  Assessment: Encounter Diagnoses  Name Primary?   Elevated LFTs Yes   Hyperbilirubinemia        78 year old female patient with PMH of psoriasis,depression, hyperlipidemia, prior cholecystectomy who presents for evaluation of elevated LFTs and bilirubin.  CT scan revealed normal liver with no intrahepatic or extrahepatic bile duct dilatation.  No abnormalities within liver or pancreas.  Platelets are normal but on the low end of normal.  Hepatitis panel negative.  At the time of patient's symptoms she did exhibit right upper quadrant pain with low-grade fever which raises the concern of stones reoccurring which can happen in about 5 to 20% of the population.  Will go ahead and recheck her levels today as well as order MRI/MRCP to r/o biliary causes.  The other consideration is patient's alcohol consumption which consist of 2 bottles of wine over 4-day period.  Every weekend.  Recommended the patient avoid all alcohol.  Patient denies any new medications.  Psoriasis, rash present all over patients body. Recently prescribed new medication.  Lung nodules, incidental finding on CT  Plan: - recheck CBC, CMP, lipase, PT/INR - order MRI/MRCP - Avoid alcohol  - ER precautions given  Thank you for the courtesy of this consult. Please call me with any questions or concerns.   Theta Leaf, FNP-C [facility] 06/01/2024, 9:34 AM  Cc: Vernon Velna SAUNDERS, MD

## 2024-06-01 NOTE — Patient Instructions (Addendum)
 Bland diet, avoid fatty greasy foods No alcohol Advised to go to the ER if there is any severe abdominal pain, unable to hold down food/water, blood in stool or vomit, chest pain, shortness of breath, or any worsening symptoms.    Your provider has requested that you go to the basement level for lab work before leaving today. Press B on the elevator. The lab is located at the first door on the left as you exit the elevator.  You have been scheduled for an MRI at [facility] on 06/12/24. Your appointment time is 8:00am. Please arrive to admitting (at main entrance of the hospital) 30 minutes prior to your appointment time for registration purposes. Please make certain not to have anything to eat or drink 6 hours prior to your test. In addition, if you have any metal in your body, have a pacemaker or defibrillator, please be sure to let your ordering physician know. This test typically takes 45 minutes to 1 hour to complete. Should you need to reschedule, please call [phone] to do so.  _______________________________________________________  If your blood pressure at your visit was 140/90 or greater, please contact your primary care physician to follow up on this.  _______________________________________________________  If you are age 64 or older, your body mass index should be between 23-30. Your Body mass index is 28.04 kg/m. If this is out of the aforementioned range listed, please consider follow up with your Primary Care Provider.  If you are age 44 or younger, your body mass index should be between 19-25. Your Body mass index is 28.04 kg/m. If this is out of the aformentioned range listed, please consider follow up with your Primary Care Provider.   ________________________________________________________  The [facility] providers would like to encourage you to use MYCHART to communicate with providers for non-urgent requests or questions.  Due to long hold times on the  telephone, sending your provider a message by [facility] may be a faster and more efficient way to get a response.  Please allow 48 business hours for a response.  Please remember that this is for non-urgent requests.  _______________________________________________________  [facility] is using a team-based approach to care.  Your team is made up of your doctor and two to three APPS. Our APPS (Nurse Practitioners and Physician Assistants) work with your physician to ensure care continuity for you. They are fully qualified to address your health concerns and develop a treatment plan. They communicate directly with your gastroenterologist to care for you. Seeing the Advanced Practice Practitioners on your physician's team can help you by facilitating care more promptly, often allowing for earlier appointments, access to diagnostic testing, procedures, and other specialty referrals.   Thank you for trusting me with your gastrointestinal care. Deanna May, FNP-C

## 2024-06-04 LAB — CULTURE, BLOOD (ROUTINE X 2)
Culture: NO GROWTH
Special Requests: ADEQUATE

## 2024-06-06 ENCOUNTER — Other Ambulatory Visit: Payer: Self-pay

## 2024-06-06 DIAGNOSIS — R7989 Other specified abnormal findings of blood chemistry: Secondary | ICD-10-CM

## 2024-06-06 NOTE — Telephone Encounter (Signed)
-----   Message from Cathryne PARAS May sent at 06/01/2024  1:36 PM EDT ----- Repeat hepatic function panel, cbc in 2 weeks please ----- Message ----- From: Interface, Lab In Three Zero One Sent: 06/01/2024  11:59 AM EDT To: Cathryne PARAS May, NP

## 2024-06-06 NOTE — Progress Notes (Signed)
 Repeat labs

## 2024-06-12 ENCOUNTER — Ambulatory Visit (HOSPITAL_COMMUNITY)
Admission: RE | Admit: 2024-06-12 | Discharge: 2024-06-12 | Disposition: A | Discharge status: Home / Self Care | Source: Ambulatory Visit | Attending: Gastroenterology

## 2024-06-12 ENCOUNTER — Other Ambulatory Visit: Payer: Self-pay | Admitting: Gastroenterology

## 2024-06-12 DIAGNOSIS — R7989 Other specified abnormal findings of blood chemistry: Secondary | ICD-10-CM | POA: Insufficient documentation

## 2024-06-12 MED ORDER — GADOBUTROL 1 MMOL/ML IV SOLN
7.5000 mL | Freq: Once | INTRAVENOUS | Status: AC | PRN
Start: 2024-06-12 — End: 2024-06-12
  Administered 2024-06-12: 7.5 mL via INTRAVENOUS

## 2024-06-15 ENCOUNTER — Telehealth: Payer: Self-pay | Admitting: Gastroenterology

## 2024-06-15 ENCOUNTER — Encounter: Payer: Self-pay | Admitting: Gastroenterology

## 2024-06-15 NOTE — Telephone Encounter (Signed)
 Good Morning Beth Black,  Patient called stating she was returning your call because sehe could not hear you to discuss her results.

## 2024-06-18 ENCOUNTER — Other Ambulatory Visit: Payer: Self-pay

## 2024-06-18 ENCOUNTER — Other Ambulatory Visit (INDEPENDENT_AMBULATORY_CARE_PROVIDER_SITE_OTHER)

## 2024-06-18 ENCOUNTER — Telehealth: Payer: Self-pay

## 2024-06-18 ENCOUNTER — Telehealth: Payer: Self-pay | Admitting: Gastroenterology

## 2024-06-18 DIAGNOSIS — K8065 Calculus of gallbladder and bile duct with chronic cholecystitis with obstruction: Secondary | ICD-10-CM

## 2024-06-18 DIAGNOSIS — K8050 Calculus of bile duct without cholangitis or cholecystitis without obstruction: Secondary | ICD-10-CM

## 2024-06-18 DIAGNOSIS — R7989 Other specified abnormal findings of blood chemistry: Secondary | ICD-10-CM | POA: Diagnosis not present

## 2024-06-18 LAB — CBC WITH DIFFERENTIAL/PLATELET
Basophils Absolute: 0.1 K/uL (ref 0.0–0.1)
Basophils Relative: 1.1 % (ref 0.0–3.0)
Eosinophils Absolute: 0.2 K/uL (ref 0.0–0.7)
Eosinophils Relative: 4.5 % (ref 0.0–5.0)
HCT: 41.1 % (ref 36.0–46.0)
Hemoglobin: 14 g/dL (ref 12.0–15.0)
Lymphocytes Relative: 32.5 % (ref 12.0–46.0)
Lymphs Abs: 1.6 K/uL (ref 0.7–4.0)
MCHC: 34 g/dL (ref 30.0–36.0)
MCV: 89.3 fl (ref 78.0–100.0)
Monocytes Absolute: 0.4 K/uL (ref 0.1–1.0)
Monocytes Relative: 8.9 % (ref 3.0–12.0)
Neutro Abs: 2.5 K/uL (ref 1.4–7.7)
Neutrophils Relative %: 53 % (ref 43.0–77.0)
Platelets: 236 K/uL (ref 150.0–400.0)
RBC: 4.6 Mil/uL (ref 3.87–5.11)
RDW: 12.5 % (ref 11.5–15.5)
WBC: 4.8 K/uL (ref 4.0–10.5)

## 2024-06-18 LAB — HEPATIC FUNCTION PANEL
ALT: 30 U/L (ref 0–35)
AST: 19 U/L (ref 0–37)
Albumin: 4.6 g/dL (ref 3.5–5.2)
Alkaline Phosphatase: 96 U/L (ref 39–117)
Bilirubin, Direct: 0.2 mg/dL (ref 0.0–0.3)
Total Bilirubin: 0.8 mg/dL (ref 0.2–1.2)
Total Protein: 6.5 g/dL (ref 6.0–8.3)

## 2024-06-18 MED ORDER — LEVOFLOXACIN 500 MG PO TABS: 500.0000 mg | ORAL_TABLET | Freq: Every day | ORAL | 0 refills | Status: AC

## 2024-06-18 NOTE — Telephone Encounter (Signed)
-----   Message from [facility] sent at 06/18/2024 11:00 AM EDT ----- Regarding: RE: ERCP Marykathleen Russi, Put the ERCP in Dr. Pamula spot under my name (he has given me the okay for this). Thanks. GM ----- Message ----- From: Anitra Odetta CROME, RN Sent: 06/18/2024   8:51 AM EDT To: Aloha Wilhelmenia Raddle., MD Subject: RE: ERCP                                       Is this to be put on with Dr Albertus or Dr Wilhelmenia in a Dr Albertus spot? ----- Message ----- From: Wilhelmenia Aloha Raddle., MD Sent: 06/15/2024   4:51 PM EDT To: Lupita FORBES Commander, MD; Norleen LOISE Kiang, MD; Gable Odonohue # Subject: RE: ERCP                                       Kenndra Morris, I spoke with [facility]. On 9/30, Dr. Albertus has an AM/PM block. I have spoken with him as well, and he is OK for me to add-on a procedure on his block. Please place this case for 1200PM ERCP - choledocholithiasis. If someone else has an earlier appointment, they can reply back (but suspect that will not be the case). Thanks. GM ----- Message ----- From: Commander Lupita FORBES, MD Sent: 06/15/2024   1:26 PM EDT To: Norleen LOISE Kiang, MD; Aloha Wilhelmenia Raddle., MD# Subject: RE: ERCP                                       My next available is 11/6  Lupita ----- Message ----- From: May, Deanna J, NP Sent: 06/15/2024   9:47 AM EDT To: Lupita FORBES Commander, MD; Norleen LOISE Kiang, MD; Gabrie# Subject: ERCP                                           Good morning, Attached is patient who needs ERCP for Choledocholithiasis. Dr Charlanne wanted me to attach everyone to see if there is an opening on anyone's schedule. I am out of the office today but will check messages. Thank you Deanna NP

## 2024-06-18 NOTE — Telephone Encounter (Signed)
 Recommendations from [facility] May NP were to treat Choledocholithiasis with Levaquin  500 mg for 5 days. Prescription sent to pharmacy   Left message for pt to call back

## 2024-06-18 NOTE — Telephone Encounter (Signed)
 ERCP has been entered for 07/03/24 at 12 noon at [facility] with GM

## 2024-06-18 NOTE — Telephone Encounter (Signed)
 Spoke with patient about MRI results and setting up ERCP.  Patient verbalizes understanding.  Patient has had 1 episode since her appointment with abdominal pain however overall doing well.  ER precautions given.  Also informed patient we would be sending antibiotics that she would need to complete.  Patient verbalizes understanding.

## 2024-06-18 NOTE — Telephone Encounter (Signed)
 Good Morning   We received a call fro patient in regards to previous note, please advise.   Thank you

## 2024-06-19 NOTE — Telephone Encounter (Signed)
 Left message on machine to call back

## 2024-06-19 NOTE — Telephone Encounter (Signed)
 Pt made aware of recommendations: Prescription was sent to pharmacy. Pt made aware.  Pt verbalized understanding with all questions answered.

## 2024-06-20 ENCOUNTER — Ambulatory Visit: Payer: Self-pay | Admitting: Gastroenterology

## 2024-06-20 NOTE — Telephone Encounter (Signed)
ERCP scheduled, pt instructed and medications reviewed.  Patient instructions mailed to home.  Patient to call with any questions or concerns.  

## 2024-06-27 ENCOUNTER — Telehealth: Payer: Self-pay

## 2024-06-27 ENCOUNTER — Encounter (HOSPITAL_COMMUNITY): Payer: Self-pay | Admitting: Gastroenterology

## 2024-06-27 NOTE — Telephone Encounter (Signed)
 Procedure:ERCP Procedure date: 06/3024 Procedure location: [facility] Arrival Time: 10:30 Spoke with the patient Y/N: Y Any prep concerns? N  Has the patient obtained the prep from the pharmacy ? N Do you have a care partner and transportation: Y Any additional concerns? N

## 2024-07-03 ENCOUNTER — Encounter (HOSPITAL_COMMUNITY): Payer: Self-pay | Admitting: Gastroenterology

## 2024-07-03 ENCOUNTER — Encounter (HOSPITAL_COMMUNITY)
Admission: RE | Disposition: A | Discharge status: Home / Self Care | Payer: Self-pay | Source: Home / Self Care | Attending: Gastroenterology

## 2024-07-03 ENCOUNTER — Other Ambulatory Visit: Payer: Self-pay

## 2024-07-03 ENCOUNTER — Ambulatory Visit (HOSPITAL_COMMUNITY): Admitting: Certified Registered"

## 2024-07-03 ENCOUNTER — Ambulatory Visit (HOSPITAL_COMMUNITY)

## 2024-07-03 ENCOUNTER — Ambulatory Visit (HOSPITAL_COMMUNITY)
Admission: RE | Admit: 2024-07-03 | Discharge: 2024-07-03 | Disposition: A | Discharge status: Home / Self Care | Attending: Gastroenterology | Admitting: Gastroenterology

## 2024-07-03 ENCOUNTER — Telehealth: Payer: Self-pay

## 2024-07-03 DIAGNOSIS — K838 Other specified diseases of biliary tract: Secondary | ICD-10-CM

## 2024-07-03 DIAGNOSIS — K3189 Other diseases of stomach and duodenum: Secondary | ICD-10-CM | POA: Diagnosis not present

## 2024-07-03 DIAGNOSIS — R1013 Epigastric pain: Secondary | ICD-10-CM | POA: Insufficient documentation

## 2024-07-03 DIAGNOSIS — R7989 Other specified abnormal findings of blood chemistry: Secondary | ICD-10-CM

## 2024-07-03 DIAGNOSIS — K8050 Calculus of bile duct without cholangitis or cholecystitis without obstruction: Secondary | ICD-10-CM

## 2024-07-03 DIAGNOSIS — K449 Diaphragmatic hernia without obstruction or gangrene: Secondary | ICD-10-CM

## 2024-07-03 DIAGNOSIS — Z9049 Acquired absence of other specified parts of digestive tract: Secondary | ICD-10-CM | POA: Insufficient documentation

## 2024-07-03 DIAGNOSIS — K8065 Calculus of gallbladder and bile duct with chronic cholecystitis with obstruction: Secondary | ICD-10-CM

## 2024-07-03 DIAGNOSIS — K2960 Other gastritis without bleeding: Secondary | ICD-10-CM

## 2024-07-03 DIAGNOSIS — K8040 Calculus of bile duct with cholecystitis, unspecified, without obstruction: Secondary | ICD-10-CM | POA: Insufficient documentation

## 2024-07-03 DIAGNOSIS — R748 Abnormal levels of other serum enzymes: Secondary | ICD-10-CM | POA: Diagnosis not present

## 2024-07-03 DIAGNOSIS — Z87891 Personal history of nicotine dependence: Secondary | ICD-10-CM | POA: Diagnosis not present

## 2024-07-03 HISTORY — PX: ERCP: SHX5425

## 2024-07-03 SURGERY — ERCP, WITH INTERVENTION IF INDICATED
Anesthesia: General

## 2024-07-03 MED ORDER — SUGAMMADEX SODIUM 200 MG/2ML IV SOLN
INTRAVENOUS | Status: DC | PRN
  Administered 2024-07-03: 200 mg via INTRAVENOUS

## 2024-07-03 MED ORDER — SODIUM CHLORIDE 0.9 % IV SOLN: INTRAVENOUS | Status: DC

## 2024-07-03 MED ORDER — ONDANSETRON HCL 4 MG/2ML IJ SOLN
INTRAMUSCULAR | Status: DC | PRN
  Administered 2024-07-03: 4 mg via INTRAVENOUS

## 2024-07-03 MED ORDER — CIPROFLOXACIN IN D5W 400 MG/200ML IV SOLN
INTRAVENOUS | Status: DC | PRN
Start: 2024-07-03 — End: 2024-07-03
  Administered 2024-07-03: 400 mg via INTRAVENOUS

## 2024-07-03 MED ORDER — PANTOPRAZOLE SODIUM 20 MG PO TBEC: 20.0000 mg | DELAYED_RELEASE_TABLET | Freq: Every day | ORAL | 6 refills | Status: AC

## 2024-07-03 MED ORDER — DEXAMETHASONE SODIUM PHOSPHATE 10 MG/ML IJ SOLN
INTRAMUSCULAR | Status: DC | PRN
  Administered 2024-07-03: 10 mg via INTRAVENOUS

## 2024-07-03 MED ORDER — CIPROFLOXACIN IN D5W 400 MG/200ML IV SOLN
INTRAVENOUS | Status: AC
Start: 2024-07-03 — End: 2024-07-03
  Filled 2024-07-03: qty 200

## 2024-07-03 MED ORDER — EPHEDRINE SULFATE (PRESSORS) 50 MG/ML IJ SOLN
INTRAMUSCULAR | Status: DC | PRN
  Administered 2024-07-03 (×2): 10 mg via INTRAVENOUS

## 2024-07-03 MED ORDER — PHENYLEPHRINE HCL (PRESSORS) 10 MG/ML IV SOLN
INTRAVENOUS | Status: DC | PRN
  Administered 2024-07-03 (×2): 160 ug via INTRAVENOUS

## 2024-07-03 MED ORDER — FENTANYL CITRATE (PF) 250 MCG/5ML IJ SOLN
INTRAMUSCULAR | Status: DC | PRN
  Administered 2024-07-03 (×2): 50 ug via INTRAVENOUS

## 2024-07-03 MED ORDER — PROPOFOL 10 MG/ML IV BOLUS
INTRAVENOUS | Status: DC | PRN
  Administered 2024-07-03: 120 mg via INTRAVENOUS

## 2024-07-03 MED ORDER — SODIUM CHLORIDE 0.9 % IV SOLN
INTRAVENOUS | Status: DC | PRN
  Administered 2024-07-03: 30 mL

## 2024-07-03 MED ORDER — DICLOFENAC SUPPOSITORY 100 MG
RECTAL | Status: DC | PRN
Start: 2024-07-03 — End: 2024-07-03
  Administered 2024-07-03: 100 mg via RECTAL

## 2024-07-03 MED ORDER — FENTANYL CITRATE (PF) 100 MCG/2ML IJ SOLN
INTRAMUSCULAR | Status: AC
  Filled 2024-07-03: qty 2

## 2024-07-03 MED ORDER — ROCURONIUM 10MG/ML (10ML) SYRINGE FOR MEDFUSION PUMP - OPTIME
INTRAVENOUS | Status: DC | PRN
  Administered 2024-07-03: 50 mg via INTRAVENOUS

## 2024-07-03 MED ORDER — PROPOFOL 10 MG/ML IV BOLUS
INTRAVENOUS | Status: AC
  Filled 2024-07-03: qty 20

## 2024-07-03 MED ORDER — LIDOCAINE 2% (20 MG/ML) 5 ML SYRINGE
INTRAMUSCULAR | Status: DC | PRN
  Administered 2024-07-03: 60 mg via INTRAVENOUS

## 2024-07-03 NOTE — Discharge Instructions (Signed)
 Start taking pantoprazole  (Protonix ) once daily for at least the next month or two.  YOU HAD AN ENDOSCOPIC PROCEDURE TODAY: Refer to the procedure report and other information in the discharge instructions given to you for any specific questions about what was found during the examination. If this information does not answer your questions, please call [place] office at [phone] to clarify.   YOU SHOULD EXPECT: Some feelings of bloating in the abdomen. Passage of more gas than usual. Walking can help get rid of the air that was put into your GI tract during the procedure and reduce the bloating.  DIET: Your first meal following the procedure should be a light meal and then it is ok to progress to your normal diet. A half-sandwich or bowl of soup is an example of a good first meal. Heavy or fried foods are harder to digest and may make you feel nauseous or bloated. Drink plenty of fluids but you should avoid alcoholic beverages for 24 hours.  ACTIVITY: Your care partner should take you home directly after the procedure. You should plan to take it easy, moving slowly for the rest of the day. You can resume normal activity the day after the procedure however YOU SHOULD NOT DRIVE, use power tools, machinery or perform tasks that involve climbing or major physical exertion for 24 hours (because of the sedation medicines used during the test).   SYMPTOMS TO REPORT IMMEDIATELY: A gastroenterologist can be reached at any hour. Please call [phone]  for any of the following symptoms:   Following upper endoscopy (EGD, EUS, ERCP, esophageal dilation) Vomiting of blood or coffee ground material  New, significant abdominal pain  New, significant chest pain or pain under the shoulder blades  Painful or persistently difficult swallowing  New shortness of breath  Black, tarry-looking or red, bloody stools  FOLLOW UP:  If any biopsies were taken you will be contacted by phone or by letter within the  next 1-3 weeks. Call [phone]  if you have not heard about the biopsies in 3 weeks.  Please also call with any specific questions about appointments or follow up tests.

## 2024-07-03 NOTE — Anesthesia Procedure Notes (Signed)
 Procedure Name: Intubation Date/Time: 07/03/2024 11:58 AM  Performed by: Nanci Riis, CRNAPre-anesthesia Checklist: Patient identified, Emergency Drugs available, Suction available, Patient being monitored and Timeout performed Patient Re-evaluated:Patient Re-evaluated prior to induction Oxygen Delivery Method: Circle system utilized Preoxygenation: Pre-oxygenation with 100% oxygen Induction Type: IV induction Ventilation: Mask ventilation without difficulty Laryngoscope Size: 3 and Miller Grade View: Grade II Tube type: Oral Tube size: 7.0 mm Number of attempts: 1 Airway Equipment and Method: Stylet Placement Confirmation: ETT inserted through vocal cords under direct vision, positive ETCO2 and breath sounds checked- equal and bilateral Secured at: 21 cm Tube secured with: Tape Dental Injury: Teeth and Oropharynx as per pre-operative assessment

## 2024-07-03 NOTE — Transfer of Care (Signed)
 Immediate Anesthesia Transfer of Care Note  Patient: Beth Black  Procedure(s) Performed: ERCP, WITH INTERVENTION IF INDICATED  Patient Location: Endoscopy Unit  Anesthesia Type:General  Level of Consciousness: drowsy and patient cooperative  Airway & Oxygen Therapy: Patient Spontanous Breathing  Post-op Assessment: Report given to RN and Post -op Vital signs reviewed and stable  Post vital signs: Reviewed and stable  Last Vitals:  Vitals Value Taken Time  BP 204/82 07/03/24 12:53  Temp    Pulse 99 07/03/24 12:53  Resp 13 07/03/24 12:53  SpO2 96 % 07/03/24 12:53    Last Pain:  Vitals:   07/03/24 1253  TempSrc:   PainSc: Asleep         Complications: No notable events documented.

## 2024-07-03 NOTE — Anesthesia Postprocedure Evaluation (Signed)
 Anesthesia Post Note  Patient: Beth Black  Procedure(s) Performed: ERCP, WITH INTERVENTION IF INDICATED     Patient location during evaluation: Endoscopy Anesthesia Type: General Level of consciousness: awake and alert Pain management: pain level controlled Vital Signs Assessment: post-procedure vital signs reviewed and stable Respiratory status: spontaneous breathing, nonlabored ventilation, respiratory function stable and patient connected to nasal cannula oxygen Cardiovascular status: blood pressure returned to baseline and stable Postop Assessment: no apparent nausea or vomiting Anesthetic complications: no   No notable events documented.  Last Vitals:  Vitals:   07/03/24 1300 07/03/24 1310  BP: (!) 177/81 (!) 170/80  Pulse: 93 88  Resp: 12 14  Temp:    SpO2: 95% 95%    Last Pain:  Vitals:   07/03/24 1310  TempSrc:   PainSc: 0-No pain                 Shyrl Obi L Khadeeja Elden

## 2024-07-03 NOTE — Telephone Encounter (Signed)
-----   Message from Aloha Wilhelmenia Raddle sent at 07/03/2024 12:53 PM EDT ----- Team, ERCP completed. Sphincterotomy had stenosed.  Extended it.  Stones removed.  Some erosive gastropathy with biopsies pending and started on PPI once daily. Repeat LFTs needed in 2 to 4 weeks (under Dr. Ira name) - Beth Black can you please arrange. Hopefully this will help all of her symptoms. Thanks. GM

## 2024-07-03 NOTE — Anesthesia Preprocedure Evaluation (Addendum)
 Anesthesia Evaluation  Patient identified by MRN, date of birth, ID band Patient awake    Reviewed: Allergy & Precautions, NPO status , Patient's Chart, lab work & pertinent test results  Airway Mallampati: III  TM Distance: >3 FB Neck ROM: Full    Dental no notable dental hx. (+) Teeth Intact, Dental Advisory Given   Pulmonary former smoker   Pulmonary exam normal breath sounds clear to auscultation       Cardiovascular Normal cardiovascular exam Rhythm:Regular Rate:Normal  HLD   Neuro/Psych  PSYCHIATRIC DISORDERS  Depression    negative neurological ROS     GI/Hepatic negative GI ROS, Neg liver ROS,,,  Endo/Other  negative endocrine ROS    Renal/GU negative Renal ROS  negative genitourinary   Musculoskeletal negative musculoskeletal ROS (+)    Abdominal   Peds  Hematology negative hematology ROS (+)   Anesthesia Other Findings   Reproductive/Obstetrics                              Anesthesia Physical Anesthesia Plan  ASA: 2  Anesthesia Plan: General   Post-op Pain Management: Minimal or no pain anticipated   Induction: Intravenous  PONV Risk Score and Plan: 3 and Dexamethasone , Ondansetron  and Treatment may vary due to age or medical condition  Airway Management Planned: Oral ETT  Additional Equipment:   Intra-op Plan:   Post-operative Plan: Extubation in OR  Informed Consent: I have reviewed the patients History and Physical, chart, labs and discussed the procedure including the risks, benefits and alternatives for the proposed anesthesia with the patient or authorized representative who has indicated his/her understanding and acceptance.     Dental advisory given  Plan Discussed with: CRNA  Anesthesia Plan Comments:          Anesthesia Quick Evaluation

## 2024-07-03 NOTE — H&P (Signed)
 GASTROENTEROLOGY PROCEDURE H&P NOTE   Primary Care Physician: Vernon Velna SAUNDERS, MD  HPI: Beth Black is a 78 y.o. female who presents for ERCP for choledocholithiasis removal.  Past Medical History:  Diagnosis Date   Depression    Hyperlipidemia    Past Surgical History:  Procedure Laterality Date   BILIARY DILATION  12/25/2018   Procedure: BILIARY DILATION;  Surgeon: Charlanne Groom, MD;  Location: [facility];  Service: Endoscopy;;   CHOLECYSTECTOMY N/A 12/22/2018   Procedure: LAPAROSCOPIC CHOLECYSTECTOMY WITH INTRAOPERATIVE CHOLANGIOGRAM;  Surgeon: Eletha Boas, MD;  Location: [facility];  Service: General;  Laterality: N/A;   ERCP N/A 12/25/2018   Procedure: ENDOSCOPIC RETROGRADE CHOLANGIOPANCREATOGRAPHY (ERCP);  Surgeon: Charlanne Groom, MD;  Location: [facility];  Service: Endoscopy;  Laterality: N/A;   REMOVAL OF STONES  12/25/2018   Procedure: REMOVAL OF STONES;  Surgeon: Charlanne Groom, MD;  Location: [facility];  Service: Endoscopy;;   SPHINCTEROTOMY  12/25/2018   Procedure: ANNETT;  Surgeon: Charlanne Groom, MD;  Location: [facility];  Service: Endoscopy;;   TOE SURGERY Bilateral 1990   5th toes   No current facility-administered medications for this encounter.   No current facility-administered medications for this encounter. Allergies  Allergen Reactions   Biaxin [Clarithromycin]     Feels jittery   Doxycycline Itching   Statins     Other Reaction(s): myalgias   Talwin [Pentazocine] Other (See Comments)    Hallucination    Family History  Problem Relation Age of Onset   Hypertension Mother    Alzheimer's disease Mother    Skin cancer Father    Liver disease Sister    Stroke Paternal Grandmother    Colon cancer Neg Hx    Social History   Socioeconomic History   Marital status: Married    Spouse name: Not on file   Number of children: 1   Years of education: Not on file   Highest education level: Not on file  Occupational History    Occupation: retired  Tobacco Use   Smoking status: Former    Current packs/day: 0.00    Types: Cigarettes    Quit date: 1983    Years since quitting: 42.7   Smokeless tobacco: Never  Vaping Use   Vaping status: Never Used  Substance and Sexual Activity   Alcohol use: Yes    Alcohol/week: 14.0 standard drinks of alcohol    Types: 14 Glasses of wine per week    Comment: 2 bottle wine on the weekends   Drug use: No   Sexual activity: Not on file  Other Topics Concern   Not on file  Social History Narrative   Not on file   Social Drivers of Health   Financial Resource Strain: Not on file  Food Insecurity: Not on file  Transportation Needs: Not on file  Physical Activity: Not on file  Stress: Not on file  Social Connections: Not on file  Intimate Partner Violence: Not on file    Physical Exam: There were no vitals filed for this visit. There is no height or weight on file to calculate BMI. GEN: NAD EYE: Sclerae anicteric ENT: MMM CV: Non-tachycardic GI: Soft, NT/ND NEURO:  Alert & Oriented x 3  Lab Results: No results for input(s): WBC, HGB, HCT, PLT in the last 72 hours. BMET No results for input(s): NA, K, CL, CO2, GLUCOSE, BUN, CREATININE, CALCIUM in the last 72 hours. LFT No results for input(s): PROT, ALBUMIN, AST, ALT, ALKPHOS, BILITOT, BILIDIR, IBILI in  the last 72 hours. PT/INR No results for input(s): LABPROT, INR in the last 72 hours.   Impression / Plan: This is a 78 y.o.female who presents for ERCP for choledocholithiasis removal.  The risks of an ERCP were discussed at length, including but not limited to the risk of perforation, bleeding, abdominal pain, post-ERCP pancreatitis (while usually mild can be severe and even life threatening).   The risks and benefits of endoscopic evaluation/treatment were discussed with the patient and/or family; these include but are not limited to the risk of perforation,  infection, bleeding, missed lesions, lack of diagnosis, severe illness requiring hospitalization, as well as anesthesia and sedation related illnesses.  The patient's history has been reviewed, patient examined, no change in status, and deemed stable for procedure.  The patient and/or family is agreeable to proceed.    [name], MD [facility] Office # [phone]

## 2024-07-03 NOTE — Telephone Encounter (Signed)
-----   Message from Aloha Wilhelmenia Raddle sent at 07/03/2024 12:53 PM EDT ----- Team, ERCP completed. Sphincterotomy had stenosed.  Extended it.  Stones removed.  Some erosive gastropathy with biopsies pending and started on PPI once daily. Repeat LFTs needed in 2 to 4 weeks (under Dr. Ira name) - Odetta can you please arrange. Hopefully this will help all of her symptoms. Thanks. GM

## 2024-07-03 NOTE — Telephone Encounter (Signed)
 Lab order has been entered

## 2024-07-03 NOTE — Op Note (Signed)
 [facility] Patient Name: Beth Black Procedure Date: 07/03/2024 MRN: [id number] Attending MD: [name] , MD, [id number] Date of Birth: 1946/01/10 CSN: [id number] Age: 78 Admit Type: Outpatient Procedure:                ERCP Indications:              Bile duct stone(s), Epigastric abdominal pain, Bile                            duct stone on magnetic resonance                            cholangiopancreatography, Elevated liver enzymes,                            Prior Endoscopic Retrograde Cholangiopancreatography Providers:                [name], MD, Jacquelyn Jaci Pierce,                            RN, Farris Southgate, Technician Referring MD:              Medicines:                General Anesthesia, Cipro 400 mg IV, Diclofenac 100                            mg rectal Complications:            No immediate complications. Estimated Blood Loss:     Estimated blood loss was minimal. Procedure:                Pre-Anesthesia Assessment:                           - Prior to the procedure, a History and Physical                            was performed, and patient medications and                            allergies were reviewed. The patient's tolerance of                            previous anesthesia was also reviewed. The risks                            and benefits of the procedure and the sedation                            options and risks were discussed with the patient.                            All questions were answered, and informed consent                            was obtained. Prior Anticoagulants:  The patient has                            taken no anticoagulant or antiplatelet agents. ASA                            Grade Assessment: II - A patient with mild systemic                            disease. After reviewing the risks and benefits,                            the patient was deemed in satisfactory condition to                             undergo the procedure.                           After obtaining informed consent, the scope was                            passed under direct vision. Throughout the                            procedure, the patient's blood pressure, pulse, and                            oxygen saturations were monitored continuously. The                            [employer] D single use                            duodenoscope was introduced through the mouth, and                            used to inject contrast into and used to inject                            contrast into the bile duct. The ERCP was                            accomplished without difficulty. The patient                            tolerated the procedure. Scope In: Scope Out: Findings:      A scout film of the abdomen was obtained. Surgical clips, consistent       with a previous cholecystectomy, were seen in the area of the right       upper quadrant of the abdomen.      The upper GI tract was traversed under direct vision without detailed       examination. A 2 cm hiatal hernia was present. Multiple dispersed small       erosions with no bleeding  and no stigmata of recent bleeding were found       in the entire examined stomach - biopsied for HP evaluation. No gross       lesions were noted in the duodenal bulb, in the first portion of the       duodenum and in the second portion of the duodenum. A biliary       sphincterotomy had been performed. The sphincterotomy appeared       stenosed/narrowed ~2-3 mm.      A 0.035 inch x 260 cm straight Hydra Jagwire was passed into the biliary       tree. The Hydratome sphincterotome was passed over the guidewire and the       bile duct was then deeply cannulated. Contrast was injected. I       personally interpreted the bile duct images. Ductal flow of contrast was       adequate. Image quality was adequate. Contrast extended to the hepatic        ducts. Opacification of the entire biliary tree except for the       gallbladder was successful. The lower third of the main bile duct and       middle third of the main bile duct contained filling defects thought to       be a stone and sludge. The main bile duct was moderately dilated. The       largest diameter was 13 mm. The biliary sphincterotomy was extended       another 6 mm in length with a monofilament Hydratome sphincterotome       using ERBE electrocautery up to 8-9 mm in size. There was no       post-sphincterotomy bleeding. To discover objects, the biliary tree was       swept with a retrieval balloon. Sludge was swept from the duct. Two       stones were removed. No stones remained. An occlusion cholangiogram was       performed that showed no further significant biliary pathology.      A pancreatogram was not performed.      The duodenoscope was withdrawn from the patient. Impression:               - 2 cm hiatal hernia.                           - Erosive gastropathy with no bleeding and no                            stigmata of recent bleeding. Biopsied.                           - No gross lesions in the duodenal bulb, in the                            first portion of the duodenum and in the second                            portion of the duodenum.                           - Prior biliary sphincterotomy appeared stenosed.                           -  Filling defects consistent with a stone and                            sludge was seen on the cholangiogram.                           - The entire main bile duct was moderately dilated.                           - Choledocholithiasis was found. Complete removal                            was accomplished by biliary sphincterotomy                            extension and balloon sweep. Moderate Sedation:      Not Applicable - Patient had care per Anesthesia. Recommendation:           - The patient will be observed  post-procedure,                            until all discharge criteria are met.                           - Discharge patient to home.                           - Patient has a contact number available for                            emergencies. The signs and symptoms of potential                            delayed complications were discussed with the                            patient. Return to normal activities tomorrow.                            Written discharge instructions were provided to the                            patient.                           - Low fat diet.                           - Watch for pancreatitis, bleeding, perforation,                            and cholangitis.                           - Initiate Protonix  20 mg daily.                           -  Check liver enzymes (AST, ALT, alkaline                            phosphatase, bilirubin) in 2 weeks.                           - The findings and recommendations were discussed                            with the patient.                           - The findings and recommendations were discussed                            with the patient's family. Procedure Code(s):        --- Professional ---                           [phone], Endoscopic retrograde                            cholangiopancreatography (ERCP); with removal of                            calculi/debris from biliary/pancreatic duct(s)                           43262, Endoscopic retrograde                            cholangiopancreatography (ERCP); with                            sphincterotomy/papillotomy                           74328, 26, Endoscopic catheterization of the                            biliary ductal system, radiological supervision and                            interpretation Diagnosis Code(s):        --- Professional ---                           K44.9, Diaphragmatic hernia without obstruction or                             gangrene                           K31.89, Other diseases of stomach and duodenum                           K80.50, Calculus of bile duct without cholangitis  or cholecystitis without obstruction                           R10.13, Epigastric pain                           R74.8, Abnormal levels of other serum enzymes                           Z98.890, Other specified postprocedural states                           K83.8, Other specified diseases of biliary tract                           R93.2, Abnormal findings on diagnostic imaging of                            liver and biliary tract CPT copyright 2022 [employer]. All rights reserved. The codes documented in this report are preliminary and upon coder review may  be revised to meet current compliance requirements. [name], MD 07/03/2024 12:50:36 PM Number of Addenda: 0

## 2024-07-04 ENCOUNTER — Ambulatory Visit: Payer: Self-pay | Admitting: Gastroenterology

## 2024-07-04 LAB — SURGICAL PATHOLOGY

## 2024-07-05 ENCOUNTER — Encounter (HOSPITAL_COMMUNITY): Payer: Self-pay | Admitting: Gastroenterology

## 2024-07-17 ENCOUNTER — Telehealth: Payer: Self-pay | Admitting: Gastroenterology

## 2024-07-17 NOTE — Telephone Encounter (Signed)
 Followed up with patient and she is doing well. Feeling much better. Reminded her to do labs in next week or town. Will also have her fup with dr charlanne in a few mths. Pt verbalizes understanding.

## 2024-07-26 ENCOUNTER — Telehealth: Payer: Self-pay

## 2024-07-26 ENCOUNTER — Other Ambulatory Visit (INDEPENDENT_AMBULATORY_CARE_PROVIDER_SITE_OTHER)

## 2024-07-26 DIAGNOSIS — K8050 Calculus of bile duct without cholangitis or cholecystitis without obstruction: Secondary | ICD-10-CM | POA: Diagnosis not present

## 2024-07-26 DIAGNOSIS — R7989 Other specified abnormal findings of blood chemistry: Secondary | ICD-10-CM

## 2024-07-26 DIAGNOSIS — K8065 Calculus of gallbladder and bile duct with chronic cholecystitis with obstruction: Secondary | ICD-10-CM | POA: Diagnosis not present

## 2024-07-26 LAB — HEPATIC FUNCTION PANEL
ALT: 15 U/L (ref 0–35)
AST: 19 U/L (ref 0–37)
Albumin: 4.5 g/dL (ref 3.5–5.2)
Alkaline Phosphatase: 67 U/L (ref 39–117)
Bilirubin, Direct: 0.2 mg/dL (ref 0.0–0.3)
Total Bilirubin: 0.7 mg/dL (ref 0.2–1.2)
Total Protein: 6.8 g/dL (ref 6.0–8.3)

## 2024-07-27 NOTE — Telephone Encounter (Signed)
 See telephone encounter.

## 2024-07-28 ENCOUNTER — Ambulatory Visit: Payer: Self-pay | Admitting: Gastroenterology
